# Patient Record
Sex: Female | Born: 1975 | Race: White | Hispanic: No | Marital: Married | Smoking: Former smoker
Health system: Southern US, Community
[De-identification: ages and names within clinical notes are randomized; demographics above are authoritative.]

## PROBLEM LIST (undated history)

## (undated) DIAGNOSIS — F329 Major depressive disorder, single episode, unspecified: Secondary | ICD-10-CM

## (undated) DIAGNOSIS — E785 Hyperlipidemia, unspecified: Secondary | ICD-10-CM

## (undated) DIAGNOSIS — F32A Depression, unspecified: Secondary | ICD-10-CM

## (undated) DIAGNOSIS — T7840XA Allergy, unspecified, initial encounter: Secondary | ICD-10-CM

## (undated) HISTORY — DX: Depression, unspecified: F32.A

## (undated) HISTORY — DX: Major depressive disorder, single episode, unspecified: F32.9

## (undated) HISTORY — PX: TONSILLECTOMY: SUR1361

## (undated) HISTORY — DX: Allergy, unspecified, initial encounter: T78.40XA

## (undated) HISTORY — PX: WISDOM TOOTH EXTRACTION: SHX21

---

## 2008-01-23 ENCOUNTER — Ambulatory Visit: Payer: Self-pay | Admitting: Diagnostic Radiology

## 2008-01-23 ENCOUNTER — Emergency Department (HOSPITAL_BASED_OUTPATIENT_CLINIC_OR_DEPARTMENT_OTHER): Admission: EM | Admit: 2008-01-23 | Discharge: 2008-01-23 | Payer: Self-pay | Admitting: Emergency Medicine

## 2008-07-12 ENCOUNTER — Emergency Department (HOSPITAL_BASED_OUTPATIENT_CLINIC_OR_DEPARTMENT_OTHER): Admission: EM | Admit: 2008-07-12 | Discharge: 2008-07-12 | Payer: Self-pay | Admitting: Emergency Medicine

## 2008-10-04 ENCOUNTER — Ambulatory Visit: Payer: Self-pay | Admitting: Family Medicine

## 2008-10-04 DIAGNOSIS — F339 Major depressive disorder, recurrent, unspecified: Secondary | ICD-10-CM | POA: Insufficient documentation

## 2008-10-04 DIAGNOSIS — F329 Major depressive disorder, single episode, unspecified: Secondary | ICD-10-CM

## 2008-10-04 DIAGNOSIS — J309 Allergic rhinitis, unspecified: Secondary | ICD-10-CM | POA: Insufficient documentation

## 2008-10-07 LAB — CONVERTED CEMR LAB
ALT: 12 units/L (ref 0–35)
AST: 15 units/L (ref 0–37)
Alkaline Phosphatase: 69 units/L (ref 39–117)
Calcium: 9.6 mg/dL (ref 8.4–10.5)
Glucose, Bld: 73 mg/dL (ref 70–99)
Potassium: 4.5 meq/L (ref 3.5–5.3)
Sodium: 143 meq/L (ref 135–145)

## 2008-11-11 ENCOUNTER — Ambulatory Visit: Payer: Self-pay | Admitting: Family Medicine

## 2008-11-11 DIAGNOSIS — M654 Radial styloid tenosynovitis [de Quervain]: Secondary | ICD-10-CM | POA: Insufficient documentation

## 2008-11-14 ENCOUNTER — Encounter: Payer: Self-pay | Admitting: Family Medicine

## 2009-01-27 ENCOUNTER — Encounter: Admission: RE | Admit: 2009-01-27 | Discharge: 2009-01-27 | Payer: Self-pay | Admitting: Family Medicine

## 2009-01-27 ENCOUNTER — Ambulatory Visit: Payer: Self-pay | Admitting: Family Medicine

## 2009-01-27 DIAGNOSIS — N644 Mastodynia: Secondary | ICD-10-CM | POA: Insufficient documentation

## 2009-07-11 ENCOUNTER — Ambulatory Visit: Payer: Self-pay | Admitting: Family Medicine

## 2009-08-22 ENCOUNTER — Ambulatory Visit: Payer: Self-pay | Admitting: Family Medicine

## 2009-08-22 DIAGNOSIS — J019 Acute sinusitis, unspecified: Secondary | ICD-10-CM | POA: Insufficient documentation

## 2009-09-02 ENCOUNTER — Telehealth: Payer: Self-pay | Admitting: Family Medicine

## 2009-12-19 ENCOUNTER — Ambulatory Visit: Payer: Self-pay | Admitting: Family Medicine

## 2009-12-23 ENCOUNTER — Encounter: Payer: Self-pay | Admitting: Family Medicine

## 2009-12-24 LAB — CONVERTED CEMR LAB
BUN: 15 mg/dL (ref 6–23)
CO2: 28 meq/L (ref 19–32)
Creatinine, Ser: 0.67 mg/dL (ref 0.40–1.20)
Glucose, Bld: 93 mg/dL (ref 70–99)
LDL Cholesterol: 95 mg/dL (ref 0–99)
Total Bilirubin: 0.4 mg/dL (ref 0.3–1.2)
Total CHOL/HDL Ratio: 5.2
VLDL: 14 mg/dL (ref 0–40)

## 2010-02-10 NOTE — Assessment & Plan Note (Signed)
Summary: breast pain   Vital Signs:  Patient profile:   35 year old female Height:      63 inches Weight:      185 pounds BMI:     32.89 O2 Sat:      97 % on Room air Pulse rate:   82 / minute BP sitting:   115 / 74  (left arm) Cuff size:   regular  Vitals Entered By: Payton Spark CMA (January 27, 2009 1:50 PM)  O2 Flow:  Room air CC: FU mood and sertraline. Also c/o R breast tenderness x 1 month.    Primary Care Provider:  Seymour Bars DO  CC:  FU mood and sertraline. Also c/o R breast tenderness x 1 month. .  History of Present Illness: 35 yo WF presents for a R breast lump and tenderness that started a month ago.  It did not change over the course of the month.  No previous mammogram.  No fam hx of breast cancer.  Drinks 1-3 cups of caffeine each day.  Period due in 2-3 wks.  She is not on any birth control.  She finished breastfeeding 10 mos ago.  Mood has improved with addition of Sertraline.  Allergies: No Known Drug Allergies  Past History:  Past Medical History: Reviewed history from 10/04/2008 and no changes required. depression allergies  Past Surgical History: Reviewed history from 10/04/2008 and no changes required. 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 Some marrital discord. Quit smoking in 03.  Goal wt 140. No exercise.  Review of Systems      See HPI  Physical Exam  General:  alert, well-developed, well-nourished, well-hydrated, and overweight-appearing.   Head:  normocephalic and atraumatic.   Neck:  shotty R>L sided anterior cervical chain LA Breasts:  large breasted.  no palpable nodules.  No axillary LA or nipple d/c.  fibrocystic bilat. Lungs:  Normal respiratory effort, chest expands symmetrically. Lungs are clear to auscultation, no crackles or wheezes. Heart:  Normal rate and regular rhythm. S1 and  S2 normal without gallop, murmur, click, rub or other extra sounds. Skin:  color normal and no rashes.   Psych:  good eye contact, not anxious appearing, and not depressed appearing.     Impression & Recommendations:  Problem # 1:  BREAST PAIN, RIGHT (ICD-611.71) Exam c/w Fibrocystic breast tissue w/o palpable masses. Given pain with dense tissue on exam, will get a baseline screening mammogram. Advised to wear a well fitting bra, avoid caffeine and use a heating pad. Call if not improved in the next month. Orders: T-Mammography Bilateral Screening ([id number])  Problem # 2:  DEPRESSION (ICD-311) Assessment: Improved RTC 2 mos for f/u. Her updated medication list for this problem includes:    Sertraline Hcl 50 Mg Tabs (Sertraline hcl) .Marland Kitchen... 1 tab by mouth daily  Complete Medication List: 1)  Nexium 20 Mg Pack (Esomeprazole magnesium) .... Take 1 tab by mouth once daily as needed 2)  Allegra 180 Mg Tabs (Fexofenadine hcl) .Marland Kitchen.. 1 tab by mouth daily for allergies 3)  Nasonex 50 Mcg/act Susp (Mometasone furoate) .... 2 sprays per nostril daily 4)  Sertraline Hcl 50 Mg Tabs (Sertraline hcl) .Marland Kitchen.. 1 tab by mouth daily  Patient Instructions: 1)  Mammogram today. 2)  Will call you w/ results in the next 2 days. 3)  Wear a supportive bra. 4)  Cut caffeine out of diet. 5)  Work on [employer], regular exercise, weight loss. 6)  F/U for depression in 2 months.

## 2010-02-10 NOTE — Progress Notes (Signed)
Summary: Sinusitis  Phone Note Call from Patient   Caller: Patient Summary of Call: Pt seen last week for sinusitis and was given amox. Pt states she felt a bit better while on amox but since completing abx she has started w/ HA, sinus pressure and congestion.  Initial call taken by: Payton Spark CMA,  September 02, 2009 3:31 PM  Follow-up for Phone Call        I will retreat her with another round.  If not cleared, will need a CT of the sinuses after this. Follow-up by: Seymour Bars DO,  September 02, 2009 3:32 PM    New/Updated Medications: CEFDINIR 300 MG CAPS (CEFDINIR) 1 capsule by mouth q 12 hrs x 10 days Prescriptions: CEFDINIR 300 MG CAPS (CEFDINIR) 1 capsule by mouth q 12 hrs x 10 days  #20 x 0   Entered and Authorized by:   Seymour Bars DO   Signed by:   Seymour Bars DO on 09/02/2009   Method used:   [facility] to        [facility] [phone]* (retail)       [address]       [place], [place]  [address]       Ph: [phone] or [phone]       Fax: [phone]   RxID:   [id number]   Appended Document: Sinusitis Pt aware

## 2010-02-10 NOTE — Assessment & Plan Note (Signed)
Summary: CPE w/o pap   Vital Signs:  Patient profile:   35 year old female Height:      63 inches Weight:      163 pounds BMI:     28.98 O2 Sat:      97 % on Room air Pulse rate:   74 / minute BP sitting:   106 / 68  (left arm) Cuff size:   regular  Vitals Entered By: Payton Spark CMA (December 19, 2009 2:38 PM)  O2 Flow:  Room air CC: CPE w/ out pap   Primary Care Cobe Viney:  Seymour Bars DO  CC:  CPE w/ out pap.  History of Present Illness: 34 yo WF presents for CPE w/o pap smear.  She is previously healthy, doing well on Sertraline for depression.  Sees [facility] for pap smear, updated in Sept.  Had tetanus vaccine 2008.  Denies fam hx of breast or colon cancer or of premature heart dz.  Fasting labs are due.  Had flu shot this year.  Has been walking more and eating better, down another 7 lbs.    Current Medications (verified): 1)  Nexium 20 Mg Pack (Esomeprazole Magnesium) .... Take 1 Tab By Mouth Once Daily As Needed 2)  Allegra 180 Mg Tabs (Fexofenadine Hcl) .Marland Kitchen.. 1 Tab By Mouth Daily For Allergies 3)  Nasonex 50 Mcg/act Susp (Mometasone Furoate) .... 2 Sprays Per Nostril Daily 4)  Sertraline Hcl 50 Mg Tabs (Sertraline Hcl) .Marland Kitchen.. 1 Tab By Mouth Daily  Allergies (verified): No Known Drug Allergies  Past History:  Past Medical History: depression allergies  gyn [facility] in [place]  Past Surgical History: Reviewed history from 10/04/2008 and no changes required. tonsillectomy LTCS x 2  Family History: Reviewed history from 10/04/2008 and no changes required. mother healthy father ? brother and sister healthy  Social History: Reviewed history from 10/04/2008 and no changes required. 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 Some marrital discord. Quit smoking in 03.  Goal wt 140. No exercise.  Review of Systems  The patient denies anorexia, fever, weight loss, weight gain, vision loss, decreased hearing,  hoarseness, chest pain, syncope, dyspnea on exertion, peripheral edema, prolonged cough, headaches, hemoptysis, abdominal pain, melena, hematochezia, severe indigestion/heartburn, hematuria, incontinence, genital sores, muscle weakness, suspicious skin lesions, transient blindness, difficulty walking, depression, unusual weight change, abnormal bleeding, enlarged lymph nodes, angioedema, breast masses, and testicular masses.    Physical Exam  General:  alert, well-developed, well-nourished, well-hydrated, and overweight-appearing.   Head:  normocephalic and atraumatic.   Eyes:  pupils equal, pupils round, and pupils reactive to light.   Ears:  no external deformities.   Nose:  no nasal discharge.   Mouth:  good dentition and pharynx pink and moist.   Neck:  supple and no masses.   Lungs:  Normal respiratory effort, chest expands symmetrically. Lungs are clear to auscultation, no crackles or wheezes. Heart:  Normal rate and regular rhythm. S1 and S2 normal without gallop, murmur, click, rub or other extra sounds. Abdomen:  soft, non-tender, normal bowel sounds, no distention, no masses, no guarding, no hepatomegaly, and no splenomegaly.   Pulses:  2+ radial and pedal pulses Extremities:  no UE or LE edema Neurologic:  gait normal.   Skin:  fair skinned Cervical Nodes:  No lymphadenopathy noted Psych:  good eye contact, not anxious appearing, and not depressed appearing.     Impression & Recommendations:  Problem # 1:  HEALTH MAINTENANCE EXAM (ICD-V70.0) Keeping healthy checklist for women reviewed. BP at goal.  BMI 28= overwt, down another 7 lbs, great! Keep working on [employer], regular exercise. Add Calcium with D to MVI daily. Immunizations are UTD. Paps per [facility]. Pt will call for her annual derm exam.  Complete Medication List: 1)  Nexium 20 Mg Pack (Esomeprazole magnesium) .... Take 1 tab by mouth once daily as needed 2)  Allegra 180 Mg Tabs (Fexofenadine hcl) .Marland Kitchen.. 1  tab by mouth daily for allergies 3)  Nasonex 50 Mcg/act Susp (Mometasone furoate) .... 2 sprays per nostril daily 4)  Sertraline Hcl 50 Mg Tabs (Sertraline hcl) .Marland Kitchen.. 1 tab by mouth daily  Other Orders: T-Comprehensive Metabolic Panel [id number]) T-Lipid Profile [id number])   Orders Added: 1)  T-Comprehensive Metabolic Panel [[id number]] 2)  T-Lipid Profile [[id number]] 3)  Est. Patient age 69-39 [phone]

## 2010-02-10 NOTE — Assessment & Plan Note (Signed)
Summary: f/u mood   Vital Signs:  Patient profile:   35 year old female Height:      63 inches Weight:      180 pounds BMI:     32.00 Pulse rate:   91 / minute BP sitting:   120 / 71  (left arm) Cuff size:   regular  Vitals Entered By: Kathlene November (July 11, 2009 1:59 PM) CC: followup med- Sertraline working well. Needs refill on Nexium   Primary Care Provider:  Seymour Bars DO  CC:  followup med- Sertraline working well. Needs refill on Nexium.  History of Present Illness: 35 yo WF presents for f/u depression.  She was started back on Sertraline in Sept.  She has done well on it.  She started [employer].  Denies any problems.  No acute life stressors.  Would like to lose more wt.  PHQ -9 is due to recheck today.      Current Medications (verified): 1)  Nexium 20 Mg Pack (Esomeprazole Magnesium) .... Take 1 Tab By Mouth Once Daily As Needed 2)  Allegra 180 Mg Tabs (Fexofenadine Hcl) .Marland Kitchen.. 1 Tab By Mouth Daily For Allergies 3)  Nasonex 50 Mcg/act Susp (Mometasone Furoate) .... 2 Sprays Per Nostril Daily 4)  Sertraline Hcl 50 Mg Tabs (Sertraline Hcl) .Marland Kitchen.. 1 Tab By Mouth Daily  Allergies (verified): No Known Drug Allergies  Comments:  Nurse/Medical Assistant: The patient's medications and allergies were reviewed with the patient and were updated in the Medication and Allergy Lists. Kathlene November (July 11, 2009 2:00 PM)  Past History:  Past Medical History: Reviewed history from 10/04/2008 and no changes required. depression allergies  Social History: Reviewed history from 10/04/2008 and no changes required. 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 Some marrital discord. Quit smoking in 03.  Goal wt 140. No exercise.  Review of Systems      See HPI  Physical Exam  General:  alert, well-developed, well-nourished, well-hydrated, and overweight-appearing.   Head:  normocephalic and atraumatic.   Psych:  good eye contact, not  anxious appearing, and not depressed appearing.     Impression & Recommendations:  Problem # 1:  DEPRESSION (ICD-311) PHQ -9 score 1.  In remission.  Continue current meds.  f/u in 4 mos. Her updated medication list for this problem includes:    Sertraline Hcl 50 Mg Tabs (Sertraline hcl) .Marland Kitchen... 1 tab by mouth daily  Complete Medication List: 1)  Nexium 20 Mg Pack (Esomeprazole magnesium) .... Take 1 tab by mouth once daily as needed 2)  Allegra 180 Mg Tabs (Fexofenadine hcl) .Marland Kitchen.. 1 tab by mouth daily for allergies 3)  Nasonex 50 Mcg/act Susp (Mometasone furoate) .... 2 sprays per nostril daily 4)  Sertraline Hcl 50 Mg Tabs (Sertraline hcl) .Marland Kitchen.. 1 tab by mouth daily 5)  Weight Watchers  .... Dx: obesity  Patient Instructions: 1)  Return in 4 mos for CPE w/o pap smear, labs. Prescriptions: SERTRALINE HCL 50 MG TABS (SERTRALINE HCL) 1 tab by mouth daily  #30 x 5   Entered and Authorized by:   Seymour Bars DO   Signed by:   Seymour Bars DO on 07/11/2009   Method used:   [facility] [phone][facility] ([facility]ail[facility]       [address]       [place], [place]  [address]  Ph: [phone] or [phone]       Fax: [phone]   RxID:   [id number] WEIGHT WATCHERS Dx: obesity  #1 x 0   Entered and Authorized by:   Seymour Bars DO   Signed by:   Seymour Bars DO on 07/11/2009   Method used:   Print then Give to Patient   RxID:   [id number] NEXIUM 20 MG PACK (ESOMEPRAZOLE MAGNESIUM) Take 1 tab by mouth once daily as needed  #30 x 3   Entered and Authorized by:   Seymour Bars DO   Signed by:   Seymour Bars DO on 07/11/2009   Method used:   [facility] to        [facility] [phone][facility] (retail)       [address]       [place], [place]  [address]       Ph: [phone] or [phone]       Fax: [phone]   RxID:   [id number]

## 2010-02-10 NOTE — Assessment & Plan Note (Signed)
Summary: sinusitis   Vital Signs:  Patient profile:   35 year old female Height:      63 inches Weight:      170 pounds BMI:     30.22 O2 Sat:      98 % on Room air Temp:     98.1 degrees F oral Pulse rate:   78 / minute BP sitting:   106 / 70  (left arm) Cuff size:   regular  Vitals Entered By: Payton Spark CMA (August 22, 2009 8:21 AM)  O2 Flow:  Room air CC: Cough, congestion, and facial pressure x 3 weeks.   Primary Care Provider:  Seymour Bars DO  CC:  Cough, congestion, and and facial pressure x 3 weeks.Marland Kitchen  History of Present Illness: 35 yo WF presents for a sore throat that began 2-3 wks ago.  She has also had rhinorrhea, nasal congestion and dry cough.  She has been taking Allegra and Nasonex but she is not clearing.  She has some production with her cough.  Denies chest tightness, SOB or hx of asthma.  She is not a smoker.  She started having sinus pressure behind her eyes a few days ago.  Denies Fevers, chills or GI upset.    Current Medications (verified): 1)  Nexium 20 Mg Pack (Esomeprazole Magnesium) .... Take 1 Tab By Mouth Once Daily As Needed 2)  Allegra 180 Mg Tabs (Fexofenadine Hcl) .Marland Kitchen.. 1 Tab By Mouth Daily For Allergies 3)  Nasonex 50 Mcg/act Susp (Mometasone Furoate) .... 2 Sprays Per Nostril Daily 4)  Sertraline Hcl 50 Mg Tabs (Sertraline Hcl) .Marland Kitchen.. 1 Tab By Mouth Daily  Allergies (verified): No Known Drug Allergies  Past History:  Past Medical History: Reviewed history from 10/04/2008 and no changes required. depression allergies  Social History: Reviewed history from 10/04/2008 and no changes required. 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 Some marrital discord. Quit smoking in 03.  Goal wt 140. No exercise.  Review of Systems      See HPI  Physical Exam  General:  alert, well-developed, well-nourished, and well-hydrated.   Head:  normocephalic and atraumatic.  frontal and maxillary sinuses  TTP Eyes:  conjunctiva mildy injected and eyes watery bilat Ears:  EACs patent; TMs translucent and gray with good cone of light and bony landmarks.  Nose:  boggy turbinates with nasal congestion Mouth:  cobblestoning, o/p w/o injection, exudates or vesicles Neck:  no masses.   Lungs:  Normal respiratory effort, chest expands symmetrically. Lungs are clear to auscultation, no crackles or wheezes.  dry cough Heart:  Normal rate and regular rhythm. S1 and S2 normal without gallop, murmur, click, rub or other extra sounds. Skin:  color normal.   Cervical Nodes:  shotty anterior cervical LNs   Impression & Recommendations:  Problem # 1:  ACUTE SINUSITIS, UNSPECIFIED (ICD-461.9) ABS secondary to underlying allergies.  Symptoms x 2-3 wks now.  Treat with Amoxicillin x 10 days.  Use Advil Cold and Sinus and stay on nasal steroid + daily anti histamine for allergies.  Call if not improved in 10 days. Her updated medication list for this problem includes:    Nasonex 50 Mcg/act Susp (Mometasone furoate) .Marland Kitchen... 2 sprays per nostril daily    Amoxicillin 875 Mg Tabs (Amoxicillin) .Marland Kitchen... 1 tab by mouth q 12 hrs x 10 days  Complete Medication List: 1)  Nexium 20 Mg Pack (Esomeprazole magnesium) .... Take 1 tab by mouth once  daily as needed 2)  Allegra 180 Mg Tabs (Fexofenadine hcl) .Marland Kitchen.. 1 tab by mouth daily for allergies 3)  Nasonex 50 Mcg/act Susp (Mometasone furoate) .... 2 sprays per nostril daily 4)  Sertraline Hcl 50 Mg Tabs (Sertraline hcl) .Marland Kitchen.. 1 tab by mouth daily 5)  Amoxicillin 875 Mg Tabs (Amoxicillin) .Marland Kitchen.. 1 tab by mouth q 12 hrs x 10 days  Patient Instructions: 1)  Take 10 days of Amoxicillin for sinus infection. 2)  Stay on allergy meds and add Advil Cold and Sinus for symptom relief. 3)  Call if not improved in 10 days. Prescriptions: AMOXICILLIN 875 MG TABS (AMOXICILLIN) 1 tab by mouth q 12 hrs x 10 days  #20 x 0   Entered and Authorized by:   Seymour Bars DO   Signed by:   Seymour Bars DO on 08/22/2009   Method used:   [facility] to        [facility] [phone][facility] (retail)       [address]       [place], [place]  [address]       Ph: [phone] or [phone]       Fax: [phone]   RxID:   [id number]

## 2010-02-25 ENCOUNTER — Ambulatory Visit (INDEPENDENT_AMBULATORY_CARE_PROVIDER_SITE_OTHER): Payer: BC Managed Care – PPO | Admitting: Family Medicine

## 2010-02-25 ENCOUNTER — Encounter: Payer: Self-pay | Admitting: Family Medicine

## 2010-02-25 DIAGNOSIS — J020 Streptococcal pharyngitis: Secondary | ICD-10-CM | POA: Insufficient documentation

## 2010-02-25 LAB — CONVERTED CEMR LAB: Rapid Strep: POSITIVE

## 2010-03-04 NOTE — Assessment & Plan Note (Signed)
Summary: strep throat   Vital Signs:  Patient profile:   35 year old female Height:      63 inches Weight:      162 pounds BMI:     28.80 O2 Sat:      98 % on Room air Temp:     98.3 degrees F oral Pulse rate:   83 / minute BP sitting:   115 / 72  (left arm) Cuff size:   regular  Vitals Entered By: Payton Spark CMA (February 25, 2010 9:40 AM)  O2 Flow:  Room air CC: STx 5 days.   Primary Care Provider:  Seymour Bars DO  CC:  STx 5 days.Marland Kitchen  History of Present Illness: 35 yo WF presents for sore throat and fatigue with a low grade fever x 5 day.  No runny nose or cough.  Her kids have been sick with fever.    She is taking Mucinex and Ibuprofen.  She is not eating much due to odynophagia.  No GI upset or rash.     Current Medications (verified): 1)  Nexium 20 Mg Pack (Esomeprazole Magnesium) .... Take 1 Tab By Mouth Once Daily As Needed 2)  Allegra 180 Mg Tabs (Fexofenadine Hcl) .Marland Kitchen.. 1 Tab By Mouth Daily For Allergies 3)  Nasonex 50 Mcg/act Susp (Mometasone Furoate) .... 2 Sprays Per Nostril Daily 4)  Sertraline Hcl 50 Mg Tabs (Sertraline Hcl) .Marland Kitchen.. 1 Tab By Mouth Daily  Allergies (verified): No Known Drug Allergies  Past History:  Past Medical History: Reviewed history from 12/19/2009 and no changes required. depression allergies  gyn [facility] in [place]  Past Surgical History: Reviewed history from 10/04/2008 and no changes required. 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 Some marrital discord. Quit smoking in 03.  Goal wt 140. No exercise.  Review of Systems      See HPI  Physical Exam  General:  alert, well-developed, well-nourished, and well-hydrated.   Head:  normocephalic and atraumatic.   Eyes:  conjunctiva Ears:  EACs patent; TMs translucent and gray with good cone of light and bony landmarks.  Nose:  no  nasal congestion Mouth:  o/p erythema, no exudates or vesicles, surgically abscent tonsils; patent airway Neck:  no masses.   Lungs:  Normal respiratory effort, chest expands symmetrically. Lungs are clear to auscultation, no crackles or wheezes. Heart:  Normal rate and regular rhythm. S1 and S2 normal without gallop, murmur, click, rub or other extra sounds. Skin:  color normal and no rashes.   Cervical Nodes:  shoddy anterior cervical LA   Impression & Recommendations:  Problem # 1:  STREPTOCOCCAL SORE THROAT (ICD-034.0) Rapid strep +.  Will treat with Bicillin LA 1 x injection today, Ibuprofen for fevers and sore throat pain, Cepacol spray, rest, clear fluids and out of work note for today and tomorrow.  Call if any problems. Orders: Rapid Strep (16109) Bicillin LA 1.2 million units Injection (U0454) Admin of Therapeutic Inj  intramuscular or subcutaneous ([address]11)  Complete Medication List: 1)  Nexium 20 Mg Pack (Esomeprazole magnesium) .... Take 1 tab by mouth once daily as needed 2)  Allegra 180 Mg Tabs (Fexofenadine hcl) .Marland Kitchen.. 1 tab by mouth daily for allergies 3)  Nasonex 50 Mcg/act Susp (Mometasone furoate) .... 2 sprays per nostril daily 4)  Sertraline Hcl 50 Mg Tabs (Sertraline hcl) .Marland Kitchen.. 1 tab by  mouth daily  Patient Instructions: 1)  Bicillin LA given today for Strep Throat. 2)  Rest, drink plenty of clear fluids, ibuprofen for sore throat pain, Cepacol spray and popsicles.   3)  If not feeling better in 5-7 days, please call.   Medication Administration  Injection # 1:    Medication: Bicillin LA 1.2 million units Injection    Diagnosis: STREPTOCOCCAL SORE THROAT (ICD-034.0)    Route: IM    Site: LUOQ gluteus    Exp Date: 05/2012    Lot #: [id number]    Patient tolerated injection without complications    Given by: Payton Spark CMA (February 25, 2010 10:07 AM)  Orders Added: 1)  Rapid Strep [60454] 2)  Bicillin LA 1.2 million units Injection [J0561] 3)  Admin of  Therapeutic Inj  intramuscular or subcutaneous [96372] 4)  Est. Patient Level III [99213]    Laboratory Results    Other Tests  Rapid Strep: positive    Medication Administration  Injection # 1:    Medication: Bicillin LA 1.2 million units Injection    Diagnosis: STREPTOCOCCAL SORE THROAT (ICD-034.0)    Route: IM    Site: LUOQ gluteus    Exp Date: 05/2012    Lot #: [id number]    Patient tolerated injection without complications    Given by: Payton Spark CMA (February 25, 2010 10:07 AM)  Orders Added: 1)  Rapid Strep [91478] 2)  Bicillin LA 1.2 million units Injection [J0561] 3)  Admin of Therapeutic Inj  intramuscular or subcutaneous [96372] 4)  Est. Patient Level III [29562]

## 2010-03-04 NOTE — Letter (Signed)
Summary: Out of Work  [facility]  [address], [address]   [place], [place] [address]   Phone: [phone]  Fax: [phone]    February 25, 2010   Employee:  TAHLIYAH ANAGNOS    To Whom It May Concern:   For Medical reasons, please excuse the above named employee from work for the following dates:  Start:   Feb 15th -16th  End:   Feb 17th  If you need additional information, please feel free to contact our office.         Sincerely,    Seymour Bars DO

## 2010-03-18 ENCOUNTER — Ambulatory Visit (INDEPENDENT_AMBULATORY_CARE_PROVIDER_SITE_OTHER): Payer: BC Managed Care – PPO | Admitting: Family Medicine

## 2010-03-18 ENCOUNTER — Encounter: Payer: Self-pay | Admitting: Family Medicine

## 2010-03-18 DIAGNOSIS — J301 Allergic rhinitis due to pollen: Secondary | ICD-10-CM | POA: Insufficient documentation

## 2010-03-24 NOTE — Assessment & Plan Note (Signed)
Summary: hay fever   Vital Signs:  Patient profile:   35 year old female Height:      63 inches Weight:      165 pounds BMI:     29.33 O2 Sat:      98 % on Room air Temp:     98.2 degrees F oral Pulse rate:   90 / minute BP sitting:   104 / 74  (left arm) Cuff size:   large  Vitals Entered By: Payton Spark CMA (March 18, 2010 1:25 PM)  O2 Flow:  Room air CC: ? Strep throat   Primary Care Provider:  Seymour Bars DO  CC:  ? Strep throat.  History of Present Illness: 35 yo WF presents for sore throat, bodyaches, rhinorrhea and fatigue.  She does have allergies.  She was concerned about having strep throat again.  Her throat is not hurting as bad as last time.  She is using Flonase and Allegra D daily which is helping some.  She has a lot of sinus pain and pressure with HAs.  No fevers or chills.  Sleeping well atnight.  Denies chest tightness, SOB or wheezing.  No hx of asthma.  Spring and Fall are usally her the worst for her.     Current Medications (verified): 1)  Nexium 20 Mg Pack (Esomeprazole Magnesium) .... Take 1 Tab By Mouth Once Daily As Needed 2)  Allegra 180 Mg Tabs (Fexofenadine Hcl) .Marland Kitchen.. 1 Tab By Mouth Daily For Allergies 3)  Nasonex 50 Mcg/act Susp (Mometasone Furoate) .... 2 Sprays Per Nostril Daily 4)  Sertraline Hcl 50 Mg Tabs (Sertraline Hcl) .Marland Kitchen.. 1 Tab By Mouth Daily  Allergies (verified): No Known Drug Allergies  Past History:  Past Medical History: Reviewed history from 12/19/2009 and no changes required. depression allergies  gyn [facility] in [place]  Past Surgical History: Reviewed history from 10/04/2008 and no changes required. 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  Married to [place] with 2 young kids. Some marrital discord. Quit smoking in 03.  Goal wt 140. No exercise.  Review of Systems      See HPI  Physical Exam  General:   alert, well-developed, well-nourished, and well-hydrated.   Head:  normocephalic and atraumatic.  sinuses NTTP Eyes:  conjunctiva clear; slightly watery, no lid edema Ears:  EACs patent; TMs translucent and gray with good cone of light and bony landmarks.  Nose:  no rhinorrhea, no nasal congesiton Mouth:  o/p pink and moist, no injection, vesicles, exudate or cobblestoning Neck:  no masses.   Lungs:  Normal respiratory effort, chest expands symmetrically. Lungs are clear to auscultation, no crackles or wheezes. Heart:  Normal rate and regular rhythm. S1 and S2 normal without gallop, murmur, click, rub or other extra sounds. Skin:  color normal.   Cervical Nodes:  No lymphadenopathy noted   Impression & Recommendations:  Problem # 1:  HAY FEVER (ICD-477.0)  Seaonal flare of allergies despite use of Allegra D + Flonase.  New onset sinus pressure in the abscence of much head congestion or postnasal drip, normal lung exam.  Given hx of allergies with recurring sinusitis, consider referral to allergist (saw ENT yrs ago).  Will change her Flonase to Veramyst since small particle size may stick better and will start Prednisone 40 mg/ day x 5 days for allergy flare.  No sign of ABS that would requrie abx  at this time.  Call in 10 days and let me know how she's doing.  Orders: Rapid Strep (16109)  Complete Medication List: 1)  Nexium 20 Mg Pack (Esomeprazole magnesium) .... Take 1 tab by mouth once daily as needed 2)  Allegra 180 Mg Tabs (Fexofenadine hcl) .Marland Kitchen.. 1 tab by mouth daily for allergies 3)  Veramyst 27.5 Mcg/spray Susp (Fluticasone furoate) .... 2 sprays/ nostril once daily 4)  Sertraline Hcl 50 Mg Tabs (Sertraline hcl) .Marland Kitchen.. 1 tab by mouth daily 5)  Prednisone 20 Mg Tabs (Prednisone) .... 2 tabs by mouth once daily x 5 days  Patient Instructions: 1)  Change Flonase to Veramyst daily and stay on Allegra D. 2)  Start Prednisone 40 mg once daily x 5 days for hay fever. 3)  If not  improved in 7 days, please call. 4)  If you need something in addition for pain, useTylenol. 5)  Consider allergy or ENT referral if not improving.   Prescriptions: PREDNISONE 20 MG TABS (PREDNISONE) 2 tabs by mouth once daily x 5 days  #10 x 0   Entered and Authorized by:   Seymour Bars DO   Signed by:   Seymour Bars DO on 03/18/2010   Method used:   Electronically to        [facility] [phone][facility] (retail)       [address]       [place], [place]  [address]       Ph: [phone] or [phone]       Fax: [phone]   RxID:   [id number] VERAMYST 27.5 MCG/SPRAY SUSP (FLUTICASONE FUROATE) 2 sprays/ nostril once daily  #1 bottle x 1   Entered and Authorized by:   Seymour Bars DO   Signed by:   Seymour Bars DO on 03/18/2010   Method used:   Electronically to        [facility] [phone][facility] (retail)       [address]       [place], [place]  [address]       Ph: [phone] or [phone]       Fax: [phone]   RxID:   [id number]    Orders Added: 1)  Rapid Strep [23762] 2)  Est. Patient Level III [83151]    Laboratory Results    Other Tests  Rapid Strep: negative

## 2010-03-25 ENCOUNTER — Ambulatory Visit: Payer: Self-pay | Admitting: Family Medicine

## 2010-06-11 ENCOUNTER — Other Ambulatory Visit: Payer: Self-pay | Admitting: Family Medicine

## 2010-09-04 ENCOUNTER — Other Ambulatory Visit: Payer: Self-pay | Admitting: Family Medicine

## 2010-09-17 ENCOUNTER — Other Ambulatory Visit: Payer: Self-pay | Admitting: Family Medicine

## 2010-09-17 MED ORDER — SERTRALINE HCL 50 MG PO TABS: 50.0000 mg | ORAL_TABLET | Freq: Every day | ORAL | Status: DC

## 2010-09-17 NOTE — Telephone Encounter (Addendum)
Pt called and said [facility] been requesting refill for her generic zoloft, and has not received a reply from our office.  Pt has CPE that will be due in 12-2010.   Plan:  Refilled medication for #30/2 refills, and then pt will need CPE in December. Jarvis Newcomer, LPN [facility]

## 2010-12-11 ENCOUNTER — Encounter: Payer: Self-pay | Admitting: Family Medicine

## 2010-12-17 ENCOUNTER — Encounter: Payer: Self-pay | Admitting: Family Medicine

## 2010-12-17 ENCOUNTER — Ambulatory Visit (INDEPENDENT_AMBULATORY_CARE_PROVIDER_SITE_OTHER): Payer: BC Managed Care – PPO | Admitting: Family Medicine

## 2010-12-17 VITALS — BP 115/78 | HR 88 | Wt 191.0 lb

## 2010-12-17 DIAGNOSIS — F329 Major depressive disorder, single episode, unspecified: Secondary | ICD-10-CM

## 2010-12-17 DIAGNOSIS — K219 Gastro-esophageal reflux disease without esophagitis: Secondary | ICD-10-CM

## 2010-12-17 DIAGNOSIS — J309 Allergic rhinitis, unspecified: Secondary | ICD-10-CM

## 2010-12-17 DIAGNOSIS — F32A Depression, unspecified: Secondary | ICD-10-CM

## 2010-12-17 MED ORDER — FLUTICASONE FUROATE 27.5 MCG/SPRAY NA SUSP: 2.0000 | Freq: Every day | NASAL | Status: DC

## 2010-12-17 MED ORDER — SERTRALINE HCL 50 MG PO TABS: 50.0000 mg | ORAL_TABLET | Freq: Every day | ORAL | Status: DC

## 2010-12-17 NOTE — Progress Notes (Signed)
  Subjective:    Patient ID: Jessica Cunningham, female    DOB: 02-04-1975, 35 y.o.   MRN: [id number]  HPI F/U depression-Doing well on her current regimen. Says doesn't want to come off her regimen in the winter time. Toerating it well. No SE.  Sleeping well.  No CP or SOB.   GERD- Uses nexium prn when the OTC prudcts don't work  AR- has been using OTC anthistamine and her veramyst every 3 days. Lately had to inc her use of veramyst.  Says getting a lot of sinus pressure and HA. Occ uses a decongestant and that helps too.    Review of Systems     Objective:   Physical Exam  Constitutional: She is oriented to person, place, and time. She appears well-developed and well-nourished.  HENT:  Head: Normocephalic and atraumatic.  Cardiovascular: Normal rate, regular rhythm and normal heart sounds.   Pulmonary/Chest: Effort normal and breath sounds normal.  Neurological: She is alert and oriented to person, place, and time.  Skin: Skin is warm and dry.  Psychiatric: She has a normal mood and affect. Her behavior is normal.          Assessment & Plan:  Depression - PHQ-9 score is 5. Discussed she really wants to stay on current regimen. F/U in 1 year. Discussed to consider going off in the spring if she would like. Says she will thinks about it  AR- Will refill Nasal steorids. If sxs worsen please f/u. Contiuen OTC decongestant and antihistamine.   GERD - refilled her nexium. Discussed inc risk of fractures w/ chronic use.

## 2011-01-11 ENCOUNTER — Other Ambulatory Visit: Payer: Self-pay | Admitting: Family Medicine

## 2011-10-29 ENCOUNTER — Encounter: Payer: Self-pay | Admitting: Family Medicine

## 2011-10-29 ENCOUNTER — Ambulatory Visit (INDEPENDENT_AMBULATORY_CARE_PROVIDER_SITE_OTHER): Payer: BC Managed Care – PPO | Admitting: Family Medicine

## 2011-10-29 VITALS — BP 124/79 | HR 86 | Ht 62.5 in | Wt 177.0 lb

## 2011-10-29 DIAGNOSIS — F329 Major depressive disorder, single episode, unspecified: Secondary | ICD-10-CM

## 2011-10-29 DIAGNOSIS — F32A Depression, unspecified: Secondary | ICD-10-CM

## 2011-10-29 MED ORDER — FLUOXETINE HCL 10 MG PO TABS: ORAL_TABLET | ORAL | Status: DC

## 2011-10-29 NOTE — Progress Notes (Signed)
  Subjective:    Patient ID: Jessica Cunningham, female    DOB: [birth year], 36 y.o.   MRN: [id number]  HPI She weaned off her zoloft about 8 months ago.  Says in the last month has been more irritable, short fuse, adn periods of sadness and apathy. Feels her emotions are restarting.  Says she would like to restart it.  Says winter is hard for her. Has been through counseling several times before. Says starting a new group at church and is hoping that will help. Says she feels she gained weight on it.  Has lost 20 lbs since came off. Didn't do well on Wellbutrin.    Review of Systems     Objective:   Physical Exam  Constitutional: She is oriented to person, place, and time. She appears well-developed and well-nourished.  HENT:  Head: Normocephalic and atraumatic.  Cardiovascular: Normal rate, regular rhythm and normal heart sounds.   Pulmonary/Chest: Effort normal and breath sounds normal.  Neurological: She is alert and oriented to person, place, and time.  Skin: Skin is warm and dry.  Psychiatric: She has a normal mood and affect. Her behavior is normal.          Assessment & Plan:  Depression - PHQ- 9 score of 7 today.  Declined counseling.  Will try staring fluoxetine to see if works but without the weight gain. F/U in 1 months. Call  If changes her mind and decides she would like to restart the sertraline instead. It was she will do well with this one.  Declined flu vaccine. She says she would at the [facility] department this year.

## 2011-11-29 ENCOUNTER — Encounter: Payer: Self-pay | Admitting: Family Medicine

## 2011-11-29 ENCOUNTER — Ambulatory Visit (INDEPENDENT_AMBULATORY_CARE_PROVIDER_SITE_OTHER): Payer: BC Managed Care – PPO | Admitting: Family Medicine

## 2011-11-29 VITALS — BP 124/73 | HR 75 | Ht 60.5 in | Wt 173.0 lb

## 2011-11-29 DIAGNOSIS — F32A Depression, unspecified: Secondary | ICD-10-CM

## 2011-11-29 DIAGNOSIS — F329 Major depressive disorder, single episode, unspecified: Secondary | ICD-10-CM

## 2011-11-29 MED ORDER — FLUOXETINE HCL 20 MG PO CAPS: 20.0000 mg | ORAL_CAPSULE | Freq: Every day | ORAL | Status: DC

## 2011-11-29 NOTE — Progress Notes (Signed)
  Subjective:    Patient ID: Jessica Cunningham, female    DOB: 04-29-1975, 36 y.o.   MRN: [id number]  HPI Depression - Doing well.  Says she has not had any S.E On the medications. Sleeping well..  Mood well controlled. She feels like herself.  Taking 20mg  of fluoxetine.    Review of Systems     Objective:   Physical Exam  Constitutional: She is oriented to person, place, and time. She appears well-developed and well-nourished.  HENT:  Head: Normocephalic and atraumatic.  Cardiovascular: Normal rate, regular rhythm and normal heart sounds.   Pulmonary/Chest: Effort normal and breath sounds normal.  Neurological: She is alert and oriented to person, place, and time.  Skin: Skin is warm and dry.  Psychiatric: She has a normal mood and affect. Her behavior is normal.          Assessment & Plan:  Depression - PHQ- 9 score of 2.  Doing very well. Continue current regimen.  New rx sent.   F/U in 8 weeks.

## 2012-01-17 ENCOUNTER — Other Ambulatory Visit: Payer: Self-pay | Admitting: Family Medicine

## 2012-02-29 ENCOUNTER — Other Ambulatory Visit: Payer: Self-pay | Admitting: Family Medicine

## 2012-06-01 ENCOUNTER — Encounter: Payer: Self-pay | Admitting: Family Medicine

## 2012-06-01 ENCOUNTER — Ambulatory Visit (INDEPENDENT_AMBULATORY_CARE_PROVIDER_SITE_OTHER): Payer: BC Managed Care – PPO | Admitting: Family Medicine

## 2012-06-01 VITALS — BP 111/64 | HR 68 | Wt 188.0 lb

## 2012-06-01 DIAGNOSIS — F329 Major depressive disorder, single episode, unspecified: Secondary | ICD-10-CM

## 2012-06-01 DIAGNOSIS — R5383 Other fatigue: Secondary | ICD-10-CM

## 2012-06-01 DIAGNOSIS — J309 Allergic rhinitis, unspecified: Secondary | ICD-10-CM

## 2012-06-01 DIAGNOSIS — R5381 Other malaise: Secondary | ICD-10-CM

## 2012-06-01 DIAGNOSIS — F32A Depression, unspecified: Secondary | ICD-10-CM

## 2012-06-01 LAB — CBC
MCH: 28.9 pg (ref 26.0–34.0)
MCV: 85.6 fL (ref 78.0–100.0)
Platelets: 136 10*3/uL — ABNORMAL LOW (ref 150–400)
RDW: 13.4 % (ref 11.5–15.5)
WBC: 8.6 10*3/uL (ref 4.0–10.5)

## 2012-06-01 LAB — IRON: Iron: 59 ug/dL (ref 42–145)

## 2012-06-01 LAB — VITAMIN B12: Vitamin B-12: 574 pg/mL (ref 211–911)

## 2012-06-01 MED ORDER — FLUTICASONE FUROATE 27.5 MCG/SPRAY NA SUSP: 2.0000 | Freq: Every day | NASAL | Status: DC

## 2012-06-01 MED ORDER — FLUOXETINE HCL 40 MG PO CAPS: 40.0000 mg | ORAL_CAPSULE | Freq: Every day | ORAL | Status: DC

## 2012-06-01 NOTE — Progress Notes (Signed)
  Subjective:    Patient ID: Jessica Cunningham, female    DOB: [birth year], 37 y.o.   MRN: [id number]  HPI   Depression-the last couple months she has felt like the fluoxetine is not working quite as well. Has been much mor fatigued.  Did have some more panic attacks over the [facility] but that finally leveled off. Has been more apethetic and not motivation and desire.  Has to make herself do something.  No major new stresses.   AR - needs refill on nasal spray.    Review of Systems No fever or sweats.  No swollen glands or bumps.  No hx of thyroid problems. Has gained weight as well.     Objective:   Physical Exam  Constitutional: She is oriented to person, place, and time. She appears well-developed and well-nourished.  HENT:  Head: Normocephalic and atraumatic.  Neck: Neck supple. No thyromegaly present.  Cardiovascular: Normal rate, regular rhythm and normal heart sounds.   Pulmonary/Chest: Effort normal and breath sounds normal.  Lymphadenopathy:    She has no cervical adenopathy.  Neurological: She is alert and oriented to person, place, and time.  Skin: Skin is warm and dry.  Psychiatric: She has a normal mood and affect. Her behavior is normal.          Assessment & Plan:  Depression - Uncontrolled. PHQ - 9 score of 9 which is up from 6 months ago (was 2).  Discussed different options. I think it's reasonable to try to increase her dose of fluoxetine to 40 mg for one month and then see her back to evaluate and hopefully this will be helpful for her to get back to her baseline. Strongly encourage regular exercise to help improve her symptoms as well.  Fatigue - New problem.  Most likely related to increase in depressive symptoms but we will also evaluate further for anemia, thyroid disorder. Given lab slip today and we should have the results on Monday.  Allergic rhinitis-will refill her Veramyst.

## 2012-06-09 ENCOUNTER — Telehealth: Payer: Self-pay

## 2012-06-09 ENCOUNTER — Ambulatory Visit (INDEPENDENT_AMBULATORY_CARE_PROVIDER_SITE_OTHER): Payer: BC Managed Care – PPO | Admitting: Family Medicine

## 2012-06-09 ENCOUNTER — Encounter: Payer: Self-pay | Admitting: Family Medicine

## 2012-06-09 VITALS — BP 103/58 | HR 67 | Temp 98.0°F | Wt 189.0 lb

## 2012-06-09 DIAGNOSIS — J029 Acute pharyngitis, unspecified: Secondary | ICD-10-CM

## 2012-06-09 DIAGNOSIS — J020 Streptococcal pharyngitis: Secondary | ICD-10-CM

## 2012-06-09 MED ORDER — PENICILLIN V POTASSIUM 500 MG PO TABS: ORAL_TABLET | ORAL | Status: AC

## 2012-06-09 NOTE — Telephone Encounter (Signed)
She will need to be seen. We can call an antibiotic without diagnosing her with strep. She is certainly welcome to go to urgent care if she cannot come in during business hours today. They're available next 4 to 8:00 tonight and over the weekend if she would like to be seen.

## 2012-06-09 NOTE — Telephone Encounter (Signed)
Per Dr Linford Arnold she needs an appointment.

## 2012-06-09 NOTE — Progress Notes (Signed)
CC: Jessica Cunningham is a 37 y.o. female is here for Sore Throat   Subjective: HPI:  Patient complains of waking up this morning with a sore throat moderate in severity present with and without swallowing but worse with swallowing. Described as a burning in the back of the throat that's nonradiating. Slightly improved with cold beverages no other interventions. Son diagnosed with strep yesterday. She endorses fatigue worse than baseline since waking today with some subjective chills. Denies fevers, myalgias, joint pain, rashes, headache, motor sensory disturbances, runny nose, nasal congestion, cough, shortness of breath   Review Of Systems Outlined In HPI  Past Medical History  Diagnosis Date  . Depression   . Allergy      No family history on file.   History  Substance Use Topics  . Smoking status: Former Smoker    Quit date: 01/11/2001  . Smokeless tobacco: Not on file  . Alcohol Use:      Objective: Filed Vitals:   06/09/12 1139  BP: 103/58  Pulse: 67  Temp: 98 F (36.7 C)    General: Alert and Oriented, No Acute Distress HEENT: Pupils equal, round, reactive to light. Conjunctivae clear.  External ears unremarkable, canals clear with intact TMs with appropriate landmarks.  Middle ear appears open without effusion. Pink inferior turbinates.  Moist mucous membranes, soft palate and uvula with mild petechiae pharynx without inflammation nor lesions.  Neck supple with shotty anterior chain lymphadenopathy. Midline uvula Lungs: Clear to auscultation bilaterally, no wheezing/ronchi/rales.  Comfortable work of breathing. Good air movement. Cardiac: Regular rate and rhythm. Normal S1/S2.  No murmurs, rubs, nor gallops.   Extremities: No peripheral edema.  Strong peripheral pulses.  Mental Status: No depression, anxiety, nor agitation. Skin: Warm and dry.  Assessment & Plan: Jessica Cunningham was seen today for sore throat.  Diagnoses and associated orders for this visit:  Acute  pharyngitis - POCT rapid strep A  Strep pharyngitis - penicillin v potassium (VEETID) 500 MG tablet; One by mouth every 12 hours for ten days, take 1 hour before or 2 hours after meals.    Exposure and soft palate petechia concerning for strep pharyngitis therefore start penicillin and nonsteroidal anti-inflammatories for pain consider Chloraseptic spray. Urgent reevaluation  Return if symptoms worsen or fail to improve.

## 2012-06-09 NOTE — Telephone Encounter (Signed)
Jessica Cunningham complains of sore throat. Denies fever, chills or sweats. Her son was diagnosed with strep throat. I offered her an appointment. She declined the appointment. NKDA. She wanted me to talk to Dr Linford Arnold.   [facility]

## 2012-06-29 ENCOUNTER — Other Ambulatory Visit: Payer: Self-pay | Admitting: Family Medicine

## 2012-06-30 ENCOUNTER — Other Ambulatory Visit: Payer: Self-pay | Admitting: *Deleted

## 2012-06-30 MED ORDER — FLUOXETINE HCL 40 MG PO CAPS: 40.0000 mg | ORAL_CAPSULE | Freq: Every day | ORAL | Status: DC

## 2012-07-03 ENCOUNTER — Telehealth: Payer: Self-pay | Admitting: *Deleted

## 2012-07-03 DIAGNOSIS — R899 Unspecified abnormal finding in specimens from other organs, systems and tissues: Secondary | ICD-10-CM

## 2012-07-03 NOTE — Telephone Encounter (Signed)
Pt needs recheck on platelets- Barry Dienes, LPN

## 2012-07-04 LAB — CBC
HCT: 38 % (ref 36.0–46.0)
MCH: 28.1 pg (ref 26.0–34.0)
MCHC: 33.2 g/dL (ref 30.0–36.0)
RDW: 13.8 % (ref 11.5–15.5)

## 2012-07-07 ENCOUNTER — Ambulatory Visit (INDEPENDENT_AMBULATORY_CARE_PROVIDER_SITE_OTHER): Payer: BC Managed Care – PPO | Admitting: Family Medicine

## 2012-07-07 ENCOUNTER — Encounter: Payer: Self-pay | Admitting: Family Medicine

## 2012-07-07 VITALS — BP 110/67 | HR 73 | Ht 62.0 in | Wt 189.0 lb

## 2012-07-07 DIAGNOSIS — F341 Dysthymic disorder: Secondary | ICD-10-CM

## 2012-07-07 DIAGNOSIS — F418 Other specified anxiety disorders: Secondary | ICD-10-CM

## 2012-07-07 MED ORDER — VORTIOXETINE HBR 10 MG PO TABS: 10.0000 mg | ORAL_TABLET | Freq: Every day | ORAL | Status: DC

## 2012-07-07 MED ORDER — FLUOXETINE HCL (PMDD) 10 MG PO CAPS: ORAL_CAPSULE | ORAL | Status: DC

## 2012-07-07 MED ORDER — ESOMEPRAZOLE MAGNESIUM 20 MG PO CPDR: DELAYED_RELEASE_CAPSULE | ORAL | Status: DC

## 2012-07-07 NOTE — Progress Notes (Signed)
  Subjective:    Patient ID: Jessica Cunningham, female    DOB: [birth year], 37 y.o.   MRN: [id number]  HPI Depression/anxiety- Has noticed more irritability and snapping more often. She feels really tired.  She didn't feel ike this on zoloft.  Doesn't do well with wellbutrin or Paxil.  She feels excessively tried.   Feels less depressed.   Review of Systems     Objective:   Physical Exam  Constitutional: She is oriented to person, place, and time. She appears well-developed and well-nourished.  HENT:  Head: Normocephalic and atraumatic.  Eyes: Conjunctivae and EOM are normal.  Cardiovascular: Normal rate.   Pulmonary/Chest: Effort normal.  Neurological: She is alert and oriented to person, place, and time.  Skin: Skin is dry. No pallor.  Psychiatric: She has a normal mood and affect. Her behavior is normal.          Assessment & Plan:  Depression/anxiety - PHQ 9 score of 10 today. Overall he thinks she still really struggling. Fatigue is still her biggest issue. She has done well on sertraline in the past but says it comes 2 months for the headaches to go away and really doesn't want to do that again. She has been on the 40 mg of fluoxetine for a month now and still hasn't noticed a major change in her mood. I discussed with her that I really don't think she is clearly not in remission. I think we should discontinue the fluoxetine. Taper written. I would like her to start Brintella.  3 weeks of samples given and coupon card given. If she does the pharmacy and it's not covered and is not reasonable with a coupon card and we can consider trying Effexor instead. I encouraged her to call me if it doesn't go through. She can go ahead and restart her taper off the fluoxetine.

## 2012-07-11 ENCOUNTER — Encounter: Payer: Self-pay | Admitting: Family Medicine

## 2012-07-11 ENCOUNTER — Other Ambulatory Visit: Payer: Self-pay | Admitting: Family Medicine

## 2012-07-11 MED ORDER — SERTRALINE HCL 50 MG PO TABS: ORAL_TABLET | ORAL | Status: DC

## 2012-10-16 ENCOUNTER — Encounter: Payer: Self-pay | Admitting: Family Medicine

## 2012-10-17 ENCOUNTER — Other Ambulatory Visit: Payer: Self-pay | Admitting: Family Medicine

## 2012-10-17 MED ORDER — FLUOXETINE HCL 20 MG PO CAPS: 20.0000 mg | ORAL_CAPSULE | Freq: Every day | ORAL | Status: DC

## 2012-10-17 MED ORDER — SERTRALINE HCL 50 MG PO TABS: 50.0000 mg | ORAL_TABLET | Freq: Every day | ORAL | Status: DC

## 2012-11-16 ENCOUNTER — Other Ambulatory Visit: Payer: Self-pay

## 2012-12-29 ENCOUNTER — Ambulatory Visit (INDEPENDENT_AMBULATORY_CARE_PROVIDER_SITE_OTHER): Payer: BC Managed Care – PPO | Admitting: Family Medicine

## 2012-12-29 ENCOUNTER — Encounter: Payer: Self-pay | Admitting: Family Medicine

## 2012-12-29 VITALS — BP 127/68 | HR 81 | Temp 98.2°F | Ht 62.0 in | Wt 203.0 lb

## 2012-12-29 DIAGNOSIS — F32A Depression, unspecified: Secondary | ICD-10-CM

## 2012-12-29 DIAGNOSIS — Z23 Encounter for immunization: Secondary | ICD-10-CM

## 2012-12-29 DIAGNOSIS — F329 Major depressive disorder, single episode, unspecified: Secondary | ICD-10-CM

## 2012-12-29 MED ORDER — SERTRALINE HCL 50 MG PO TABS: 50.0000 mg | ORAL_TABLET | Freq: Every day | ORAL | Status: DC

## 2012-12-29 MED ORDER — SERTRALINE HCL 50 MG PO TABS: 75.0000 mg | ORAL_TABLET | Freq: Every day | ORAL | Status: DC

## 2012-12-29 NOTE — Progress Notes (Signed)
   Subjective:    Patient ID: Jessica Cunningham, female    DOB: [birth year], 37 y.o.   MRN: [id number]  HPI F/u depression - doing well.  Says increased appetite.  Says doing really well overall. She is happy with this medication. Has been on  it 3 times over her lifetime. It does seem to work well for her over all. She currently denies any side effects..  Says wonders if she will need to be on it for a lifetime.     Review of Systems     Objective:   Physical Exam  Constitutional: She is oriented to person, place, and time. She appears well-developed and well-nourished.  HENT:  Head: Normocephalic and atraumatic.  Cardiovascular: Normal rate, regular rhythm and normal heart sounds.   Pulmonary/Chest: Effort normal and breath sounds normal.  Neurological: She is alert and oriented to person, place, and time.  Skin: Skin is warm and dry.  Psychiatric: She has a normal mood and affect. Her behavior is normal.          Assessment & Plan:  Depression - doing well overall.  PHQ- 9 score of 2. F/U in 6 month. RF sent to pharmacy.

## 2013-01-03 ENCOUNTER — Encounter: Payer: Self-pay | Admitting: Emergency Medicine

## 2013-01-03 ENCOUNTER — Emergency Department
Admission: EM | Admit: 2013-01-03 | Discharge: 2013-01-03 | Disposition: A | Discharge status: Home / Self Care | Payer: BC Managed Care – PPO | Source: Home / Self Care | Attending: Emergency Medicine | Admitting: Emergency Medicine

## 2013-01-03 DIAGNOSIS — J111 Influenza due to unidentified influenza virus with other respiratory manifestations: Secondary | ICD-10-CM

## 2013-01-03 DIAGNOSIS — J209 Acute bronchitis, unspecified: Secondary | ICD-10-CM

## 2013-01-03 DIAGNOSIS — J309 Allergic rhinitis, unspecified: Secondary | ICD-10-CM

## 2013-01-03 LAB — POCT INFLUENZA A/B
Influenza A, POC: NEGATIVE
Influenza B, POC: NEGATIVE

## 2013-01-03 MED ORDER — OSELTAMIVIR PHOSPHATE 75 MG PO CAPS: ORAL_CAPSULE | ORAL | Status: DC

## 2013-01-03 MED ORDER — AZITHROMYCIN 250 MG PO TABS: ORAL_TABLET | ORAL | Status: DC

## 2013-01-03 NOTE — ED Notes (Signed)
Cough, body aches, fever, sore throat x 2 days

## 2013-01-03 NOTE — ED Provider Notes (Signed)
CSN: [id number]     Arrival date & time 01/03/13  1017 History   First MD Initiated Contact with Patient 01/03/13 1019     Chief Complaint  Patient presents with  . Influenza   (Consider location/radiation/quality/duration/timing/severity/associated sxs/prior Treatment) HPI FLU  HPI : Flu symptoms for about 1 day. Fever to 101 with chills, sweats, myalgias, fatigue, headache. Symptoms are progressively worsening, despite trying OTC fever reducing medicine and rest and fluids. Has decreased appetite, but tolerating some liquids by mouth. No history of recent tick bite. Exposed to someone with recent diagnosis of influenza.  Review of Systems: Positive for fatigue, mild nasal congestion, mild sore throat, mild swollen anterior neck glands, moderate cough, mild yellow sputum. Negative for acute vision changes, stiff neck, focal weakness, syncope, seizures, respiratory distress, vomiting, diarrhea, GU symptoms, new Rash. Past Medical History  Diagnosis Date  . Depression   . Allergy    Past Surgical History  Procedure Laterality Date  . Tonsillectomy    . Ltcs      x2   No family history on file. History  Substance Use Topics  . Smoking status: Former Smoker    Quit date: 01/11/2001  . Smokeless tobacco: Not on file  . Alcohol Use: Not on file   OB History   Grav Para Term Preterm Abortions TAB SAB Ect Mult Living                 Review of Systems  Allergies  Paxil; Sertraline; and Wellbutrin  Home Medications   Current Outpatient Rx  Name  Route  Sig  Dispense  Refill  . esomeprazole (NEXIUM) 20 MG capsule      TAKE ONE CAPSULE BY MOUTH EVERY DAY AS NEEDED   30 capsule   1   . fluticasone (VERAMYST) 27.5 MCG/SPRAY nasal spray   Nasal   Place 2 sprays into the nose daily.   10 g   6   . sertraline (ZOLOFT) 50 MG tablet   Oral   Take 1.5 tablets (75 mg total) by mouth daily.   45 tablet   6    BP 129/83  Pulse 87  Temp(Src) 98 F (36.7 C) (Oral)   Ht 5\' 3"  (1.6 m)  Wt 200 lb (90.719 kg)  BMI 35.44 kg/m2  SpO2 96% Physical Exam  Nursing note and vitals reviewed. Constitutional: She appears well-developed and well-nourished.  Non-toxic appearance. She appears ill (very fatigued, but no cardiorespiratory distress). No distress.  HENT:  Head: Normocephalic and atraumatic.  Right Ear: Tympanic membrane and external ear normal.  Left Ear: Tympanic membrane and external ear normal.  Nose: Rhinorrhea present.  Mouth/Throat: Mucous membranes are normal. Posterior oropharyngeal erythema (mild redness ) present. No oropharyngeal exudate.  Eyes: Conjunctivae are normal. Right eye exhibits no discharge. Left eye exhibits no discharge. No scleral icterus.  Neck: Neck supple.  Cardiovascular: Normal rate, regular rhythm and normal heart sounds.   Pulmonary/Chest: No stridor. No respiratory distress. She has no decreased breath sounds. She has no wheezes. She has rhonchi (anterior). She has no rales.  Occasional hacking cough noted.  Abdominal: Soft. There is no tenderness.  Musculoskeletal: She exhibits no edema.  Lymphadenopathy:    She has cervical adenopathy (mild shoddy anterior cervical nodes).  Neurological: She is alert.  Skin: Skin is warm and intact. No rash noted. She is diaphoretic.  Psychiatric: She has a normal mood and affect.    ED Course  Procedures (including critical care time) Labs Review  Labs Reviewed  POCT INFLUENZA A/B   Imaging Review No results found.  EKG Interpretation    Date/Time:    Ventricular Rate:    PR Interval:    QRS Duration:   QT Interval:    QTC Calculation:   R Axis:     Text Interpretation:              MDM    Rapid flu test for influenza A and B. are both negative. Clinically, diagnosis is acute influenza-like illness. It still possible that she has a true influenza. Also, clinically has acute bronchitis. Risks, benefits, alternatives discussed. Tamiflu prescribed 75 twice  a day x5 days. Z-Pak for bacterial coverage. Other symptomatic care discussed Followup with PCP if no better one week, sooner if worse or new symptoms  Precautions discussed. Red flags discussed. Questions invited and answered. Patient voiced understanding and agreement.      Lajean Manes, MD 01/03/13 1341

## 2013-01-29 ENCOUNTER — Other Ambulatory Visit: Payer: Self-pay | Admitting: Family Medicine

## 2013-03-27 ENCOUNTER — Encounter: Payer: Self-pay | Admitting: Sports Medicine

## 2013-03-27 ENCOUNTER — Ambulatory Visit (INDEPENDENT_AMBULATORY_CARE_PROVIDER_SITE_OTHER): Payer: BC Managed Care – PPO | Admitting: Sports Medicine

## 2013-03-27 VITALS — BP 114/79 | HR 74 | Ht 62.0 in | Wt 202.0 lb

## 2013-03-27 DIAGNOSIS — J0100 Acute maxillary sinusitis, unspecified: Secondary | ICD-10-CM | POA: Insufficient documentation

## 2013-03-27 MED ORDER — AZITHROMYCIN 250 MG PO TABS: ORAL_TABLET | ORAL | Status: DC

## 2013-03-27 MED ORDER — BECLOMETHASONE DIPROPIONATE 80 MCG/ACT NA AERS: 1.0000 | INHALATION_SPRAY | Freq: Every day | NASAL | Status: DC

## 2013-03-27 NOTE — Progress Notes (Signed)
  Subjective:    CC: Sinus pain  HPI: Patient is a pleasant 38 yo female who presents with sinus pain for one week. She has had pain in the frontal and maxillary sinuses, runny nose, ans sore throat. The symptoms have not been improving. She has tried Aleve cold and sinus, Veramist, and Allegra with temporary relief. Denies cough, ear pain, changes in vision.   Past medical history, Surgical history, Family history not pertinant except as noted below, Social history, Allergies, and medications have been entered into the medical record, reviewed, and no changes needed.   Review of Systems: No fevers, chills, night sweats, weight loss, chest pain, or shortness of breath.   Objective:    General: Well Developed, well nourished, and in no acute distress.  Neuro: Alert and oriented x3, extra-ocular muscles intact, sensation grossly intact.  HEENT: Normocephalic, atraumatic, pupils equal round reactive to light, neck supple, no masses, no lymphadenopathy, thyroid nonpalpable. Tender to palpation over maxillary and frontal sinuses.  Skin: Warm and dry, no rashes. Cardiac: Regular rate and rhythm, no murmurs rubs or gallops, no lower extremity edema.  Respiratory: Clear to auscultation bilaterally. Not using accessory muscles, speaking in full sentences.   Impression and Recommendations:

## 2013-03-27 NOTE — Assessment & Plan Note (Signed)
Switching to QNasl, azithromycin. Return as needed.

## 2013-04-23 ENCOUNTER — Emergency Department
Admission: EM | Admit: 2013-04-23 | Discharge: 2013-04-23 | Disposition: A | Discharge status: Home / Self Care | Payer: BC Managed Care – PPO | Source: Home / Self Care | Attending: Emergency Medicine | Admitting: Emergency Medicine

## 2013-04-23 ENCOUNTER — Encounter: Payer: Self-pay | Admitting: Emergency Medicine

## 2013-04-23 ENCOUNTER — Emergency Department (INDEPENDENT_AMBULATORY_CARE_PROVIDER_SITE_OTHER): Payer: BC Managed Care – PPO

## 2013-04-23 DIAGNOSIS — M79609 Pain in unspecified limb: Secondary | ICD-10-CM

## 2013-04-23 DIAGNOSIS — S60229A Contusion of unspecified hand, initial encounter: Secondary | ICD-10-CM

## 2013-04-23 DIAGNOSIS — S60222A Contusion of left hand, initial encounter: Secondary | ICD-10-CM

## 2013-04-23 HISTORY — DX: Hyperlipidemia, unspecified: E78.5

## 2013-04-23 NOTE — ED Notes (Signed)
Pt c/o LT hand injury x 2wks ago, post fall. She has taken aleve intermittently with some relief.

## 2013-04-23 NOTE — ED Provider Notes (Signed)
CSN: [id number]     Arrival date & time 04/23/13  1807 History   First MD Initiated Contact with Patient 04/23/13 1820     Chief Complaint  Patient presents with  . Hand Injury    Patient is a 38 y.o. female presenting with hand injury. The history is provided by the patient.  Hand Injury Location:  Hand Time since incident:  2 weeks Injury: yes   Mechanism of injury comment:  Accidentally fell, landing on the lateral left hand to brace her fall Hand location:  L hand Pain details:    Quality:  Sharp   Radiates to:  Does not radiate   Severity:  Moderate (3/10)   Timing:  Constant   Progression:  Unchanged Chronicity:  New Handedness:  Right-handed Foreign body present:  No foreign bodies Prior injury to area:  No Relieved by: Aleve helped somewhat. Associated symptoms: decreased range of motion, stiffness and swelling   Associated symptoms: no back pain, no fatigue, no fever, no muscle weakness, no neck pain, no numbness and no tingling   Risk factors: no concern for non-accidental trauma, no known bone disorder, no frequent fractures and no recent illness     Past Medical History  Diagnosis Date  . Depression   . Allergy   . Hyperlipidemia    Past Surgical History  Procedure Laterality Date  . Tonsillectomy    . Ltcs      x2  . Cesarean section     History reviewed. No pertinent family history. History  Substance Use Topics  . Smoking status: Former Smoker    Quit date: 01/11/2001  . Smokeless tobacco: Not on file  . Alcohol Use: Yes   OB History   Grav Para Term Preterm Abortions TAB SAB Ect Mult Living                 Review of Systems  Constitutional: Negative for fever and fatigue.  Musculoskeletal: Positive for stiffness. Negative for back pain and neck pain.  All other systems reviewed and are negative.   Allergies  Paxil; Sertraline; and Wellbutrin  Home Medications   Current Outpatient Rx  Name  Route  Sig  Dispense  Refill  .  fexofenadine (ALLEGRA) 180 MG tablet   Oral   Take 180 mg by mouth daily.         Marland Kitchen. azithromycin (ZITHROMAX Z-PAK) 250 MG tablet      Take 2 tablets (500 mg) on  Day 1,  followed by 1 tablet (250 mg) once daily on Days 2 through 5.   6 each   0   . Beclomethasone Dipropionate (QNASL) 80 MCG/ACT AERS   Each Nare   Place 1 spray into both nostrils daily.   1 Inhaler   11   . NEXIUM 20 MG capsule      TAKE ONE CAPSULE BY MOUTH EVERY DAY AS NEEDED   30 capsule   1   . sertraline (ZOLOFT) 50 MG tablet   Oral   Take 1.5 tablets (75 mg total) by mouth daily.   45 tablet   6    BP 130/83  Pulse 80  Temp(Src) 98.2 F (36.8 C) (Oral)  Resp 18  Ht 5' 2.5" (1.588 m)  Wt 202 lb (91.627 kg)  BMI 36.33 kg/m2  SpO2 97%  LMP 04/13/2013 Physical Exam  Nursing note and vitals reviewed. Constitutional: She is oriented to person, place, and time. She appears well-developed and well-nourished. No distress.  HENT:  Head: Normocephalic and atraumatic.  Eyes: Conjunctivae and EOM are normal. Pupils are equal, round, and reactive to light. No scleral icterus.  Neck: Normal range of motion.  Cardiovascular: Normal rate.   Pulmonary/Chest: Effort normal.  Abdominal: She exhibits no distension.  Musculoskeletal:       Left wrist: Normal. She exhibits normal range of motion, no bony tenderness, no swelling and no deformity.       Left hand: She exhibits decreased range of motion, bony tenderness and swelling. She exhibits normal capillary refill, no deformity and no laceration. Normal sensation noted. Normal strength noted.       Hands: Swollen and tender left fifth MCPJ area, as depicted. No skin abnormality. Decreased range of motion. Tendons tested, intact. No ligamentous instability. Neurovascular distally intact  Neurological: She is alert and oriented to person, place, and time.  Skin: Skin is warm.  Psychiatric: She has a normal mood and affect.    ED Course  Procedures  (including critical care time) Labs Review Labs Reviewed - No data to display Imaging Review Dg Hand Complete Left  04/23/2013   CLINICAL DATA:  Fall with left hand pain in the fifth metacarpal region.  EXAM: LEFT HAND - COMPLETE 3+ VIEW  COMPARISON:  None.  FINDINGS: There is no evidence of fracture or dislocation. There is no evidence of arthropathy or other focal bone abnormality. Soft tissues are unremarkable.  IMPRESSION: No acute fracture.   Electronically Signed   By: Irish LackGlenn  Yamagata M.D.   On: 04/23/2013 19:03     MDM   1. Contusion of left hand    X-ray negative.--Discussed with patient and gave her a printed copy of x-ray report. Treatment options discussed, as well as risks, benefits, alternatives. Patient voiced understanding and agreement with the following plans: Ace bandage applied. She declined prescription pain med, she prefers to use ibuprofen when necessary Other advice given.--Gradually increase passive range of motion over the next few days and then progress to further range of motion. Followup with PCP or orthopedist if not completely better in one to 2 weeks, sooner if worse or new symptoms. Precautions discussed. Red flags discussed. Questions invited and answered. Patient voiced understanding and agreement.     Lajean Manesavid Massey, MD 04/23/13 [phone]

## 2013-04-30 ENCOUNTER — Ambulatory Visit (INDEPENDENT_AMBULATORY_CARE_PROVIDER_SITE_OTHER): Payer: BC Managed Care – PPO | Admitting: Physician Assistant

## 2013-04-30 ENCOUNTER — Encounter: Payer: Self-pay | Admitting: Physician Assistant

## 2013-04-30 VITALS — BP 106/73 | HR 91 | Temp 98.2°F | Ht 62.0 in | Wt 202.0 lb

## 2013-04-30 DIAGNOSIS — J209 Acute bronchitis, unspecified: Secondary | ICD-10-CM

## 2013-04-30 MED ORDER — AZITHROMYCIN 250 MG PO TABS: ORAL_TABLET | ORAL | Status: DC

## 2013-04-30 NOTE — Progress Notes (Signed)
   Subjective:    Patient ID: Jessica Cunningham, female    DOB: Nov 18, 1975, 38 y.o.   MRN: [id number]  HPI Pt is a 38 yo female who presents to the clinic with 5 days of productive cough. She had a low grade fever Saturday night of 100. Cough is productive with yellow sputum. She also had some sinus pressure and facial pain for 2-3 days which cleared with mucinex. Aleve has helped feel some better. She feels like she hears crackling in chest and feels like her chest is tight. No one else is sick. No ear pain or ST.       Review of Systems     Objective:   Physical Exam  Constitutional: She is oriented to person, place, and time. She appears well-developed and well-nourished.  HENT:  Head: Normocephalic and atraumatic.  Right Ear: External ear normal.  Left Ear: External ear normal.  Mouth/Throat: Oropharynx is clear and moist. No oropharyngeal exudate.  TM's clear bilaterally.  Bilateral nasal turbinates red and swollen.  Negative for maxillary or frontal sinus pressure.   Eyes: Conjunctivae are normal. Right eye exhibits no discharge. Left eye exhibits no discharge.  Neck: Normal range of motion. Neck supple.  Cardiovascular: Normal rate, regular rhythm and normal heart sounds.   Pulmonary/Chest: Effort normal and breath sounds normal. She has no wheezes.  No rhonchi.  Lymphadenopathy:    She has no cervical adenopathy.  Neurological: She is alert and oriented to person, place, and time.  Skin: Skin is dry.  Psychiatric: She has a normal mood and affect. Her behavior is normal.          Assessment & Plan:  Acute bronchitis- peak flows were in the green zone. Treated with zpak. Gave HO for other symptomatic care. Encouraged to continue mucinex twice a day. deslym at night.  Stay hydrated. Discussed shot of solumedrol but pt has never had steroids so opted to wait. If not improving may consider. Continue using qnasl.

## 2013-04-30 NOTE — Patient Instructions (Signed)
Acute Bronchitis Bronchitis is inflammation of the airways that extend from the windpipe into the lungs (bronchi). The inflammation often causes mucus to develop. This leads to a cough, which is the most common symptom of bronchitis.  In acute bronchitis, the condition usually develops suddenly and goes away over time, usually in a couple weeks. Smoking, allergies, and asthma can make bronchitis worse. Repeated episodes of bronchitis may cause further lung problems.  CAUSES Acute bronchitis is most often caused by the same virus that causes a cold. The virus can spread from person to person (contagious).  SIGNS AND SYMPTOMS   Cough.   Fever.   Coughing up mucus.   Body aches.   Chest congestion.   Chills.   Shortness of breath.   Sore throat.  DIAGNOSIS  Acute bronchitis is usually diagnosed through a physical exam. Tests, such as chest X-rays, are sometimes done to rule out other conditions.  TREATMENT  Acute bronchitis usually goes away in a couple weeks. Often times, no medical treatment is necessary. Medicines are sometimes given for relief of fever or cough. Antibiotics are usually not needed but may be prescribed in certain situations. In some cases, an inhaler may be recommended to help reduce shortness of breath and control the cough. A cool mist vaporizer may also be used to help thin bronchial secretions and make it easier to clear the chest.  HOME CARE INSTRUCTIONS  Get plenty of rest.   Drink enough fluids to keep your urine clear or pale yellow (unless you have a medical condition that requires fluid restriction). Increasing fluids may help thin your secretions and will prevent dehydration.   Only take over-the-counter or prescription medicines as directed by your health care provider.   Avoid smoking and secondhand smoke. Exposure to cigarette smoke or irritating chemicals will make bronchitis worse. If you are a smoker, consider using nicotine gum or skin  patches to help control withdrawal symptoms. Quitting smoking will help your lungs heal faster.   Reduce the chances of another bout of acute bronchitis by washing your hands frequently, avoiding people with cold symptoms, and trying not to touch your hands to your mouth, nose, or eyes.   Follow up with your health care provider as directed.  SEEK MEDICAL CARE IF: Your symptoms do not improve after 1 week of treatment.  SEEK IMMEDIATE MEDICAL CARE IF:  You develop an increased fever or chills.   You have chest pain.   You have severe shortness of breath.  You have bloody sputum.   You develop dehydration.  You develop fainting.  You develop repeated vomiting.  You develop a severe headache. MAKE SURE YOU:   Understand these instructions.  Will watch your condition.  Will get help right away if you are not doing well or get worse. Document Released: 02/05/2004 Document Revised: 08/30/2012 Document Reviewed: 06/20/2012 [employer] Patient Information 2014 [employer].  

## 2013-05-03 ENCOUNTER — Other Ambulatory Visit: Payer: Self-pay | Admitting: Family Medicine

## 2013-05-03 MED ORDER — MONTELUKAST SODIUM 10 MG PO TABS: 10.0000 mg | ORAL_TABLET | Freq: Every day | ORAL | Status: DC

## 2013-05-15 ENCOUNTER — Ambulatory Visit (INDEPENDENT_AMBULATORY_CARE_PROVIDER_SITE_OTHER): Payer: BC Managed Care – PPO | Admitting: Family Medicine

## 2013-05-15 ENCOUNTER — Encounter: Payer: Self-pay | Admitting: Family Medicine

## 2013-05-15 VITALS — BP 122/83 | HR 78 | Temp 97.6°F | Wt 200.0 lb

## 2013-05-15 DIAGNOSIS — R519 Headache, unspecified: Secondary | ICD-10-CM

## 2013-05-15 DIAGNOSIS — R51 Headache: Secondary | ICD-10-CM

## 2013-05-15 DIAGNOSIS — J019 Acute sinusitis, unspecified: Secondary | ICD-10-CM

## 2013-05-15 MED ORDER — AMOXICILLIN-POT CLAVULANATE 875-125 MG PO TABS: 1.0000 | ORAL_TABLET | Freq: Two times a day (BID) | ORAL | Status: DC

## 2013-05-15 NOTE — Patient Instructions (Signed)
Call if not feeling at least 40% better in about 5-6 days.

## 2013-05-15 NOTE — Progress Notes (Signed)
   Subjective:    Patient ID: Jessica Cunningham, female    DOB: November 23, 1975, 38 y.o.   MRN: [id number]  HPI Has been having headaches that start over the nasal bridge and radiating up towards the top of her head. Fever to 100.7 this morning. She took some Tylenol. The nausea last night. No visual changes. She was seen about 2 weeks ago for acute bronchitis and put on azithromycin. She is better but still has a cough.  Currently on Allegra, Singulair and she nasal to treat her allergies. Unfortunately she tends to get recurrent sinus infections in the spring. One year she ended up seeing ear nose and throat specialist and was on 5 rounds of antibiotics before she was completely better.  Review of Systems     Objective:   Physical Exam  Constitutional: She is oriented to person, place, and time. She appears well-developed and well-nourished.  HENT:  Head: Normocephalic and atraumatic.  Right Ear: External ear normal.  Left Ear: External ear normal.  Nose: Nose normal.  Mouth/Throat: Oropharynx is clear and moist.  TMs and canals are clear.   Eyes: Conjunctivae and EOM are normal. Pupils are equal, round, and reactive to light.  Neck: Neck supple. No thyromegaly present.  Cardiovascular: Normal rate, regular rhythm and normal heart sounds.   Pulmonary/Chest: Effort normal and breath sounds normal. She has no wheezes.  Lymphadenopathy:    She has no cervical adenopathy.  Neurological: She is alert and oriented to person, place, and time.  Skin: Skin is warm and dry.  Psychiatric: She has a normal mood and affect.          Assessment & Plan:  Acute sinusitis-suspect that this is a bacterial infection secondary to her recent bronchitis. She did just complete a course of azithromycin about a week ago. Will treat with Augmentin for broader spectrum coverage. If not at least 40% better in the next 5-6 days then please call the office back and consider switching to a fork when alone. Consider  adding nasal rinses to her regimen of atenolol and histamine, nasal steroid spray, and Singulair. Also consider referral to an allergist for formal allergy testing and possible immunotherapy. She said she will think about it. Again recommend switching to an NSAID consider Tylenol for additional pain relief for her headaches and fever control.

## 2013-05-24 ENCOUNTER — Encounter: Payer: Self-pay | Admitting: Family Medicine

## 2013-05-25 ENCOUNTER — Other Ambulatory Visit: Payer: Self-pay | Admitting: Family Medicine

## 2013-05-25 MED ORDER — AMOXICILLIN-POT CLAVULANATE 875-125 MG PO TABS: 1.0000 | ORAL_TABLET | Freq: Two times a day (BID) | ORAL | Status: DC

## 2013-06-18 ENCOUNTER — Other Ambulatory Visit: Payer: Self-pay | Admitting: Family Medicine

## 2013-06-21 ENCOUNTER — Telehealth: Payer: Self-pay

## 2013-06-21 NOTE — Telephone Encounter (Signed)
[employer] has denied prior-auth for esomeprazole magnesium. The prescription has to be processed as brand name Nexium. I called the pharmacy and they tried to run it through as brand Nexium and it was accepted.    Explanation of determination is in your box.

## 2013-07-27 ENCOUNTER — Ambulatory Visit (INDEPENDENT_AMBULATORY_CARE_PROVIDER_SITE_OTHER): Payer: BC Managed Care – PPO | Admitting: Family Medicine

## 2013-07-27 ENCOUNTER — Encounter: Payer: Self-pay | Admitting: Family Medicine

## 2013-07-27 VITALS — BP 112/71 | HR 82 | Temp 98.1°F | Wt 204.0 lb

## 2013-07-27 DIAGNOSIS — J069 Acute upper respiratory infection, unspecified: Secondary | ICD-10-CM

## 2013-07-27 NOTE — Progress Notes (Signed)
   Subjective:    Patient ID: Jessica Cunningham, female    DOB: October 11, 1975, 38 y.o.   MRN: [id number]  HPI ST on Monday night that was severe. Felt fatigued adn had some diarrhea. Then started to develop cogestion in nose and having a lot of post nasal drip. No fever, chills or sweats.  Has tried OTC Advil Sinus and nasal rinse.   Using vermyst.     Review of Systems     Objective:   Physical Exam  Constitutional: She is oriented to person, place, and time. She appears well-developed and well-nourished.  HENT:  Head: Normocephalic and atraumatic.  Right Ear: External ear normal.  Left Ear: External ear normal.  Nose: Nose normal.  Mouth/Throat: Oropharynx is clear and moist.  TMs and canals are clear.   Eyes: Conjunctivae and EOM are normal. Pupils are equal, round, and reactive to light.  Neck: Neck supple. No thyromegaly present.  Cardiovascular: Normal rate, regular rhythm and normal heart sounds.   Pulmonary/Chest: Effort normal and breath sounds normal. She has no wheezes.  Lymphadenopathy:    She has no cervical adenopathy.  Neurological: She is alert and oriented to person, place, and time.  Skin: Skin is warm and dry.  Psychiatric: She has a normal mood and affect. Her behavior is normal.          Assessment & Plan:  URI - Gave reassurance. Recommend symptomatic care. Call next week if not better.

## 2013-07-27 NOTE — Patient Instructions (Signed)
Call next week if not better.

## 2013-07-28 ENCOUNTER — Emergency Department
Admission: EM | Admit: 2013-07-28 | Discharge: 2013-07-28 | Disposition: A | Discharge status: Home / Self Care | Payer: BC Managed Care – PPO | Source: Home / Self Care | Attending: Emergency Medicine | Admitting: Emergency Medicine

## 2013-07-28 ENCOUNTER — Encounter: Payer: Self-pay | Admitting: Emergency Medicine

## 2013-07-28 DIAGNOSIS — H66009 Acute suppurative otitis media without spontaneous rupture of ear drum, unspecified ear: Secondary | ICD-10-CM

## 2013-07-28 DIAGNOSIS — J0101 Acute recurrent maxillary sinusitis: Secondary | ICD-10-CM

## 2013-07-28 DIAGNOSIS — J0100 Acute maxillary sinusitis, unspecified: Secondary | ICD-10-CM

## 2013-07-28 DIAGNOSIS — H66002 Acute suppurative otitis media without spontaneous rupture of ear drum, left ear: Secondary | ICD-10-CM

## 2013-07-28 LAB — POCT RAPID STREP A (OFFICE): Rapid Strep A Screen: NEGATIVE

## 2013-07-28 MED ORDER — FLUCONAZOLE 150 MG PO TABS: 150.0000 mg | ORAL_TABLET | Freq: Once | ORAL | Status: DC

## 2013-07-28 MED ORDER — CEFTRIAXONE SODIUM 1 G IJ SOLR
1.0000 g | INTRAMUSCULAR | Status: AC
  Administered 2013-07-28: 1 g via INTRAMUSCULAR

## 2013-07-28 MED ORDER — CEFDINIR 300 MG PO CAPS: 300.0000 mg | ORAL_CAPSULE | Freq: Two times a day (BID) | ORAL | Status: DC

## 2013-07-28 MED ORDER — HYDROCODONE-ACETAMINOPHEN 5-325 MG PO TABS: 1.0000 | ORAL_TABLET | ORAL | Status: DC | PRN

## 2013-07-28 NOTE — ED Notes (Signed)
Reports starting with sore throat 5 days ago which progressed to nasal congestion, facial/sinus pain, and left ear pain. Was seen yesterday by Dr. Linford ArnoldMetheney and determined it to be viral, but is even more uncomfortable today.

## 2013-07-29 NOTE — ED Provider Notes (Signed)
CSN: [id number]     Arrival date & time 07/28/13  7829 History   First MD Initiated Contact with Patient 07/28/13 1036     Chief Complaint  Patient presents with  . Facial Pain  . Otalgia  . Sore Throat  . Nasal Congestion   (Consider location/radiation/quality/duration/timing/severity/associated sxs/prior Treatment) HPI Reports starting with sore throat 5 days ago which progressed to nasal congestion, facial/sinus pain, and left ear pain. Tried OTC treatment without relief. Past Medical History  Diagnosis Date  . Depression   . Allergy   . Hyperlipidemia    Past Surgical History  Procedure Laterality Date  . Tonsillectomy    . Ltcs      x2  . Cesarean section     History reviewed. No pertinent family history. History  Substance Use Topics  . Smoking status: Former Smoker    Quit date: 01/11/2001  . Smokeless tobacco: Not on file  . Alcohol Use: Yes   OB History   Grav Para Term Preterm Abortions TAB SAB Ect Mult Living                 Review of Systems  All other systems reviewed and are negative.   Allergies  Paxil; Sertraline; and Wellbutrin  Home Medications   Prior to Admission medications   Medication Sig Start Date End Date Taking? Authorizing Provider  Beclomethasone Dipropionate (QNASL) 80 MCG/ACT AERS Place 1 spray into both nostrils daily. 03/27/13   Monica Becton, MD  cefdinir (OMNICEF) 300 MG capsule Take 1 capsule (300 mg total) by mouth 2 (two) times daily. X 10 days 07/28/13   Lajean Manes, MD  fexofenadine (ALLEGRA) 180 MG tablet Take 180 mg by mouth daily.    Historical Provider, MD  fluconazole (DIFLUCAN) 150 MG tablet Take 1 tablet (150 mg total) by mouth once. Take 1 now, then may repeat x1 in 4 days, for yeast infection. 07/28/13   Lajean Manes, MD  HYDROcodone-acetaminophen (NORCO/VICODIN) 5-325 MG per tablet Take 1-2 tablets by mouth every 4 (four) hours as needed for severe pain. Take with food. 07/28/13   Lajean Manes, MD   montelukast (SINGULAIR) 10 MG tablet Take 1 tablet (10 mg total) by mouth at bedtime. 05/03/13   Agapito Games, MD  NEXIUM 20 MG capsule TAKE ONE CAPSULE BY MOUTH EVERY DAY AS NEEDED 06/18/13   Agapito Games, MD  sertraline (ZOLOFT) 50 MG tablet Take 1.5 tablets (75 mg total) by mouth daily. 12/29/12   Agapito Games, MD   BP 113/76  Pulse 92  Temp(Src) 98.4 F (36.9 C) (Oral)  Resp 16  Ht 5\' 2"  (1.575 m)  Wt 204 lb (92.534 kg)  BMI 37.30 kg/m2  SpO2 96% Physical Exam  Nursing note and vitals reviewed. Constitutional: She is oriented to person, place, and time. She appears well-developed and well-nourished. No distress.  HENT:  Head: Normocephalic and atraumatic.  Right Ear: Tympanic membrane, external ear and ear canal normal.  Left Ear: Tympanic membrane and ear canal normal.  Nose: Mucosal edema and rhinorrhea present. Right sinus exhibits maxillary sinus tenderness. Left sinus exhibits maxillary sinus tenderness.  Mouth/Throat: No oral lesions. No oropharyngeal exudate.  Left TM red and distorted, mildly bulging, mild air-fluid level. TM intact. Left external canal normal. No drainage.  Pharynx with red without tonsillar enlargement or exudate  Eyes: Right eye exhibits no discharge. Left eye exhibits no discharge. No scleral icterus.  Neck: Neck supple.  Cardiovascular: Normal rate, regular rhythm and  normal heart sounds.   Pulmonary/Chest: Effort normal and breath sounds normal. She has no wheezes. She has no rales.  Lymphadenopathy:    She has no cervical adenopathy.  Neurological: She is alert and oriented to person, place, and time.  Skin: Skin is warm and dry.    ED Course  Procedures (including critical care time) Labs Review Labs Reviewed  POCT RAPID STREP A (OFFICE)    Imaging Review No results found.   MDM   1. Left acute suppurative otitis media   2. Acute recurrent maxillary sinusitis    Rapid strep test negative. Treatment options  discussed, as well as risks, benefits, alternatives. --Reviewed antibiotic options. She was recently on Augmentin, so she declined that option. She declined Zithromax or Levaquin. Patient voiced understanding and agreement with the following plans: Prescribed Omnicef 300 twice a day x10 days Diflucan at her request, and case she gets yeast infection from the abx. Mucinex D She declined options of nasal steroid.  We discussed other symptomatic care. Followup with PCP if no better one week, sooner if worse or new symptoms      Lajean Manesavid Massey, MD 07/29/13 1354

## 2013-07-30 ENCOUNTER — Other Ambulatory Visit: Payer: Self-pay | Admitting: Family Medicine

## 2013-08-02 ENCOUNTER — Telehealth: Payer: Self-pay | Admitting: *Deleted

## 2013-08-02 MED ORDER — PREDNISONE 20 MG PO TABS: 40.0000 mg | ORAL_TABLET | Freq: Every day | ORAL | Status: DC

## 2013-08-02 NOTE — Telephone Encounter (Signed)
Make sure not taking any aspirin products or Aleve, motrin as can cause ear ringing.  Let's cotinue with the omnicef and add oral prednisone to start.  I can see her in AM if would like. She can come in at 9:15.

## 2013-08-02 NOTE — Telephone Encounter (Signed)
Pt informed and stated that she is worried about the ringing. She was told by the Dr. In the UC that she was given one of the stronger abx. She is open to trying another abx if you feel that it would be beneficial or she can just wait until Monday and f/u with ENT.Laureen Ochs.Mynor Witkop, Viann Shoveonya [name]

## 2013-08-02 NOTE — Telephone Encounter (Signed)
We can always switch antibiotics if she feels she is only getting partial response.  We can also try a nasal steroid spray to reduce swelling in the sinuses

## 2013-08-02 NOTE — Telephone Encounter (Signed)
Pt called and stated that she was seen by Dr. Linford ArnoldMetheney last Friday for congestion and sinusitis and to f/u if needed. She stated the next day she had to go to UC because her Lymph node on the L side was swollen and tight and her ear was painful. She was told that she had an ear infection, and sinus infection she was given 1G of rocephin and cefdinir 300 BID x 10 days. She has been on the ABX for 5 days now and her ears are ringing she denies any pain but she states that they feel clogged up. She stated that she called her children's ENT office and scheduled an appt for Monday she wanted to know if Dr. Linford ArnoldMetheney had any thoughts/suggestions. Laureen Ochs.Tahja Liao, Viann Shoveonya [name]

## 2013-08-03 ENCOUNTER — Other Ambulatory Visit: Payer: Self-pay | Admitting: Family Medicine

## 2013-08-03 NOTE — Telephone Encounter (Signed)
Pt informed. Told that she can take tylenol for pain. She could not come in today told to keep appt w/ENT for Monday.Loralee PacasBarkley, Cesilia Shinn [place]Lynetta

## 2013-08-23 ENCOUNTER — Other Ambulatory Visit: Payer: Self-pay | Admitting: Family Medicine

## 2013-08-30 ENCOUNTER — Other Ambulatory Visit: Payer: Self-pay | Admitting: Family Medicine

## 2013-09-27 ENCOUNTER — Other Ambulatory Visit: Payer: Self-pay | Admitting: Family Medicine

## 2013-09-30 ENCOUNTER — Other Ambulatory Visit: Payer: Self-pay | Admitting: Family Medicine

## 2013-12-11 ENCOUNTER — Other Ambulatory Visit: Payer: Self-pay | Admitting: Family Medicine

## 2013-12-24 ENCOUNTER — Other Ambulatory Visit: Payer: Self-pay | Admitting: Family Medicine

## 2013-12-28 ENCOUNTER — Other Ambulatory Visit: Payer: Self-pay | Admitting: Family Medicine

## 2013-12-30 ENCOUNTER — Other Ambulatory Visit: Payer: Self-pay | Admitting: Family Medicine

## 2013-12-31 MED ORDER — SERTRALINE HCL 50 MG PO TABS: 50.0000 mg | ORAL_TABLET | Freq: Every day | ORAL | Status: DC

## 2014-01-14 ENCOUNTER — Ambulatory Visit (INDEPENDENT_AMBULATORY_CARE_PROVIDER_SITE_OTHER): Payer: BLUE CROSS/BLUE SHIELD | Admitting: Physician Assistant

## 2014-01-14 ENCOUNTER — Encounter: Payer: Self-pay | Admitting: Physician Assistant

## 2014-01-14 VITALS — BP 125/73 | HR 84 | Ht 62.0 in | Wt 209.0 lb

## 2014-01-14 DIAGNOSIS — F329 Major depressive disorder, single episode, unspecified: Secondary | ICD-10-CM | POA: Diagnosis not present

## 2014-01-14 DIAGNOSIS — A499 Bacterial infection, unspecified: Secondary | ICD-10-CM

## 2014-01-14 DIAGNOSIS — F32A Depression, unspecified: Secondary | ICD-10-CM

## 2014-01-14 DIAGNOSIS — M7711 Lateral epicondylitis, right elbow: Secondary | ICD-10-CM | POA: Insufficient documentation

## 2014-01-14 DIAGNOSIS — H109 Unspecified conjunctivitis: Secondary | ICD-10-CM

## 2014-01-14 DIAGNOSIS — H1089 Other conjunctivitis: Secondary | ICD-10-CM

## 2014-01-14 MED ORDER — SERTRALINE HCL 50 MG PO TABS: ORAL_TABLET | ORAL | Status: DC

## 2014-01-14 MED ORDER — POLYMYXIN B-TRIMETHOPRIM 10000-0.1 UNIT/ML-% OP SOLN: 1.0000 [drp] | OPHTHALMIC | Status: DC

## 2014-01-14 MED ORDER — MELOXICAM 15 MG PO TABS: 15.0000 mg | ORAL_TABLET | Freq: Every day | ORAL | Status: DC

## 2014-01-14 NOTE — Progress Notes (Signed)
   Subjective:    Patient ID: Jessica Cunningham, female    DOB: 07-07-1975, 39 y.o.   MRN: [id number]  HPI  Pt presents to the clinic with multiple concerns today.   This morning her right eye was matted shut with green discharge. Mild discomfort. No vision changes. No known trauma. No fever, chills, sinus pressure, or ST. Not tried anything to make better. Denies any itching.   Her right elbow and into right forearm hurts today as well. Started about 1 and 1/2 week ago after a sudden twist of her forearm. Worse when she tries to squeeze or grab things. No trauma. No knew task or lifting weights. Not tried anything to make better.   She would like refill on zoloft. She takes 1 and 1/2 tablets daily. Doing great and no concerns.   Review of Systems  All other systems reviewed and are negative.      Objective:   Physical Exam  Constitutional: She is oriented to person, place, and time. She appears well-developed and well-nourished.  HENT:  Head: Normocephalic and atraumatic.  Right Ear: External ear normal.  Left Ear: External ear normal.  Nose: Nose normal.  Mouth/Throat: Oropharynx is clear and moist.  Eyes:  Right injected conjunctiva with no active discharge.   Neck: Normal range of motion. Neck supple.  Cardiovascular: Normal rate, regular rhythm and normal heart sounds.   Pulmonary/Chest: Effort normal and breath sounds normal.  Musculoskeletal:  Tenderness to palpation over right lateral epicondyle and into forearm.  Negative finklestein, right.  Hand grip decreased due to pain 3/5.  Pain with supination.   Lymphadenopathy:    She has no cervical adenopathy.  Neurological: She is alert and oriented to person, place, and time.  Skin: Skin is dry.  Psychiatric: She has a normal mood and affect. Her behavior is normal.          Assessment & Plan:  Bacterial conjunctivitis, right- treated with polytrim for 7 days. HO given for prevention of spread. Follow up if not  improving.   Lateral right epicondylitis- HO given. Placed in tendonitis brace. mobic given for next 2 weeks. Rehab given to start. Ice 20 minutes three times a days. Follow up with Dr. Karie Schwalbe in 2 weeks for potential injection if not improving.   Depression- PHQ-9 was 0. Refilled zoloft for 6 months. Need to follow up with PCP.

## 2014-01-14 NOTE — Patient Instructions (Signed)
Lateral Epicondylitis (Tennis Elbow) with Rehab Lateral epicondylitis involves inflammation and pain around the outer portion of the elbow. The pain is caused by inflammation of the tendons in the forearm that bring back (extend) the wrist. Lateral epicondylitis is also called tennis elbow, because it is very common in tennis players. However, it may occur in any individual who extends the wrist repetitively. If lateral epicondylitis is left untreated, it may become a chronic problem. SYMPTOMS   Pain, tenderness, and inflammation on the outer (lateral) side of the elbow.  Pain or weakness with gripping activities.  Pain that increases with wrist-twisting motions (playing tennis, using a screwdriver, opening a door or a jar).  Pain with lifting objects, including a coffee cup. CAUSES  Lateral epicondylitis is caused by inflammation of the tendons that extend the wrist. Causes of injury may include:  Repetitive stress and strain on the muscles and tendons that extend the wrist.  Sudden change in activity level or intensity.  Incorrect grip in racquet sports.  Incorrect grip size of racquet (often too large).  Incorrect hitting position or technique (usually backhand, leading with the elbow).  Using a racket that is too heavy. RISK INCREASES WITH:  Sports or occupations that require repetitive and/or strenuous forearm and wrist movements (tennis, squash, racquetball, carpentry).  Poor wrist and forearm strength and flexibility.  Failure to warm up properly before activity.  Resuming activity before healing, rehabilitation, and conditioning are complete. PREVENTION   Warm up and stretch properly before activity.  Maintain physical fitness:  Strength, flexibility, and endurance.  Cardiovascular fitness.  Wear and use properly fitted equipment.  Learn and use proper technique and have a coach correct improper technique.  Wear a tennis elbow (counterforce) brace. PROGNOSIS    The course of this condition depends on the degree of the injury. If treated properly, acute cases (symptoms lasting less than 4 weeks) are often resolved in 2 to 6 weeks. Chronic (longer lasting cases) often resolve in 3 to 6 months but may require physical therapy. RELATED COMPLICATIONS   Frequently recurring symptoms, resulting in a chronic problem. Properly treating the problem the first time decreases frequency of recurrence.  Chronic inflammation, scarring tendon degeneration, and partial tendon tear, requiring surgery.  Delayed healing or resolution of symptoms. TREATMENT  Treatment first involves the use of ice and medicine to reduce pain and inflammation. Strengthening and stretching exercises may help reduce discomfort if performed regularly. These exercises may be performed at home if the condition is an acute injury. Chronic cases may require a referral to a physical therapist for evaluation and treatment. Your caregiver may advise a corticosteroid injection to help reduce inflammation. Rarely, surgery is needed. MEDICATION  If pain medicine is needed, nonsteroidal anti-inflammatory medicines (aspirin and ibuprofen), or other minor pain relievers (acetaminophen), are often advised.  Do not take pain medicine for 7 days before surgery.  Prescription pain relievers may be given, if your caregiver thinks they are needed. Use only as directed and only as much as you need.  Corticosteroid injections may be recommended. These injections should be reserved only for the most severe cases, because they can only be given a certain number of times. HEAT AND COLD  Cold treatment (icing) should be applied for 10 to 15 minutes every 2 to 3 hours for inflammation and pain, and immediately after activity that aggravates your symptoms. Use ice packs or an ice massage.  Heat treatment may be used before performing stretching and strengthening activities prescribed by your   caregiver, physical  therapist, or athletic trainer. Use a heat pack or a warm water soak. SEEK MEDICAL CARE IF: Symptoms get worse or do not improve in 2 weeks, despite treatment. EXERCISES  RANGE OF MOTION (ROM) AND STRETCHING EXERCISES - Epicondylitis, Lateral (Tennis Elbow) These exercises may help you when beginning to rehabilitate your injury. Your symptoms may go away with or without further involvement from your physician, physical therapist, or athletic trainer. While completing these exercises, remember:   Restoring tissue flexibility helps normal motion to return to the joints. This allows healthier, less painful movement and activity.  An effective stretch should be held for at least 30 seconds.  A stretch should never be painful. You should only feel a gentle lengthening or release in the stretched tissue. RANGE OF MOTION - Wrist Flexion, Active-Assisted  Extend your right / left elbow with your fingers pointing down.*  Gently pull the back of your hand towards you, until you feel a gentle stretch on the top of your forearm.  Hold this position for __________ seconds. Repeat __________ times. Complete this exercise __________ times per day.  *If directed by your physician, physical therapist or athletic trainer, complete this stretch with your elbow bent, rather than extended. RANGE OF MOTION - Wrist Extension, Active-Assisted  Extend your right / left elbow and turn your palm upwards.*  Gently pull your palm and fingertips back, so your wrist extends and your fingers point more toward the ground.  You should feel a gentle stretch on the inside of your forearm.  Hold this position for __________ seconds. Repeat __________ times. Complete this exercise __________ times per day. *If directed by your physician, physical therapist or athletic trainer, complete this stretch with your elbow bent, rather than extended. STRETCH - Wrist Flexion  Place the back of your right / left hand on a  tabletop, leaving your elbow slightly bent. Your fingers should point away from your body.  Gently press the back of your hand down onto the table by straightening your elbow. You should feel a stretch on the top of your forearm.  Hold this position for __________ seconds. Repeat __________ times. Complete this stretch __________ times per day.  STRETCH - Wrist Extension   Place your right / left fingertips on a tabletop, leaving your elbow slightly bent. Your fingers should point backwards.  Gently press your fingers and palm down onto the table by straightening your elbow. You should feel a stretch on the inside of your forearm.  Hold this position for __________ seconds. Repeat __________ times. Complete this stretch __________ times per day.  STRENGTHENING EXERCISES - Epicondylitis, Lateral (Tennis Elbow) These exercises may help you when beginning to rehabilitate your injury. They may resolve your symptoms with or without further involvement from your physician, physical therapist, or athletic trainer. While completing these exercises, remember:   Muscles can gain both the endurance and the strength needed for everyday activities through controlled exercises.  Complete these exercises as instructed by your physician, physical therapist or athletic trainer. Increase the resistance and repetitions only as guided.  You may experience muscle soreness or fatigue, but the pain or discomfort you are trying to eliminate should never worsen during these exercises. If this pain does get worse, stop and make sure you are following the directions exactly. If the pain is still present after adjustments, discontinue the exercise until you can discuss the trouble with your caregiver. STRENGTH - Wrist Flexors  Sit with your right / left forearm palm-up   and fully supported on a table or countertop. Your elbow should be resting below the height of your shoulder. Allow your wrist to extend over the edge of  the surface.  Loosely holding a __________ weight, or a piece of rubber exercise band or tubing, slowly curl your hand up toward your forearm.  Hold this position for __________ seconds. Slowly lower the wrist back to the starting position in a controlled manner. Repeat __________ times. Complete this exercise __________ times per day.  STRENGTH - Wrist Extensors  Sit with your right / left forearm palm-down and fully supported on a table or countertop. Your elbow should be resting below the height of your shoulder. Allow your wrist to extend over the edge of the surface.  Loosely holding a __________ weight, or a piece of rubber exercise band or tubing, slowly curl your hand up toward your forearm.  Hold this position for __________ seconds. Slowly lower the wrist back to the starting position in a controlled manner. Repeat __________ times. Complete this exercise __________ times per day.  STRENGTH - Ulnar Deviators  Stand with a ____________________ weight in your right / left hand, or sit while holding a rubber exercise band or tubing, with your healthy arm supported on a table or countertop.  Move your wrist, so that your pinkie travels toward your forearm and your thumb moves away from your forearm.  Hold this position for __________ seconds and then slowly lower the wrist back to the starting position. Repeat __________ times. Complete this exercise __________ times per day STRENGTH - Radial Deviators  Stand with a ____________________ weight in your right / left hand, or sit while holding a rubber exercise band or tubing, with your injured arm supported on a table or countertop.  Raise your hand upward in front of you or pull up on the rubber tubing.  Hold this position for __________ seconds and then slowly lower the wrist back to the starting position. Repeat __________ times. Complete this exercise __________ times per day. STRENGTH - Forearm Supinators   Sit with your  right / left forearm supported on a table, keeping your elbow below shoulder height. Rest your hand over the edge, palm down.  Gently grip a hammer or a soup ladle.  Without moving your elbow, slowly turn your palm and hand upward to a "thumbs-up" position.  Hold this position for __________ seconds. Slowly return to the starting position. Repeat __________ times. Complete this exercise __________ times per day.  STRENGTH - Forearm Pronators   Sit with your right / left forearm supported on a table, keeping your elbow below shoulder height. Rest your hand over the edge, palm up.  Gently grip a hammer or a soup ladle.  Without moving your elbow, slowly turn your palm and hand upward to a "thumbs-up" position.  Hold this position for __________ seconds. Slowly return to the starting position. Repeat __________ times. Complete this exercise __________ times per day.  STRENGTH - Grip  Grasp a tennis ball, a dense sponge, or a large, rolled sock in your hand.  Squeeze as hard as you can, without increasing any pain.  Hold this position for __________ seconds. Release your grip slowly. Repeat __________ times. Complete this exercise __________ times per day.  STRENGTH - Elbow Extensors, Isometric  Stand or sit upright, on a firm surface. Place your right / left arm so that your palm faces your stomach, and it is at the height of your waist.  Place your opposite hand   on the underside of your forearm. Gently push up as your right / left arm resists. Push as hard as you can with both arms, without causing any pain or movement at your right / left elbow. Hold this stationary position for __________ seconds. Gradually release the tension in both arms. Allow your muscles to relax completely before repeating. Document Released: 12/28/2004 Document Revised: 05/14/2013 Document Reviewed: 04/11/2008 [facility] Patient Information 2015 [place], [place]. This information is not intended to replace advice  given to you by your health care provider. Make sure you discuss any questions you have with your health care provider. Bacterial Conjunctivitis Bacterial conjunctivitis, commonly called pink eye, is an inflammation of the clear membrane that covers the white part of the eye (conjunctiva). The inflammation can also happen on the underside of the eyelids. The blood vessels in the conjunctiva become inflamed, causing the eye to become red or pink. Bacterial conjunctivitis may spread easily from one eye to another and from person to person (contagious).  CAUSES  Bacterial conjunctivitis is caused by bacteria. The bacteria may come from your own skin, your upper respiratory tract, or from someone else with bacterial conjunctivitis. SYMPTOMS  The normally white color of the eye or the underside of the eyelid is usually pink or red. The pink eye is usually associated with irritation, tearing, and some sensitivity to light. Bacterial conjunctivitis is often associated with a thick, yellowish discharge from the eye. The discharge may turn into a crust on the eyelids overnight, which causes your eyelids to stick together. If a discharge is present, there may also be some blurred vision in the affected eye. DIAGNOSIS  Bacterial conjunctivitis is diagnosed by your caregiver through an eye exam and the symptoms that you report. Your caregiver looks for changes in the surface tissues of your eyes, which may point to the specific type of conjunctivitis. A sample of any discharge may be collected on a cotton-tip swab if you have a severe case of conjunctivitis, if your cornea is affected, or if you keep getting repeat infections that do not respond to treatment. The sample will be sent to a lab to see if the inflammation is caused by a bacterial infection and to see if the infection will respond to antibiotic medicines. TREATMENT   Bacterial conjunctivitis is treated with antibiotics. Antibiotic eyedrops are most often  used. However, antibiotic ointments are also available. Antibiotics pills are sometimes used. Artificial tears or eye washes may ease discomfort. HOME CARE INSTRUCTIONS   To ease discomfort, apply a cool, clean washcloth to your eye for 10-20 minutes, 3-4 times a day.  Gently wipe away any drainage from your eye with a warm, wet washcloth or a cotton ball.  Wash your hands often with soap and water. Use paper towels to dry your hands.  Do not share towels or washcloths. This may spread the infection.  Change or wash your pillowcase every day.  You should not use eye makeup until the infection is gone.  Do not operate machinery or drive if your vision is blurred.  Stop using contact lenses. Ask your caregiver how to sterilize or replace your contacts before using them again. This depends on the type of contact lenses that you use.  When applying medicine to the infected eye, do not touch the edge of your eyelid with the eyedrop bottle or ointment tube. SEEK IMMEDIATE MEDICAL CARE IF:   Your infection has not improved within 3 days after beginning treatment.  You had yellow discharge  from your eye and it returns.  You have increased eye pain.  Your eye redness is spreading.  Your vision becomes blurred.  You have a fever or persistent symptoms for more than 2-3 days.  You have a fever and your symptoms suddenly get worse.  You have facial pain, redness, or swelling. MAKE SURE YOU:   Understand these instructions.  Will watch your condition.  Will get help right away if you are not doing well or get worse. Document Released: 12/28/2004 Document Revised: 05/14/2013 Document Reviewed: 05/31/2011 [facility] Patient Information 2015 [place], [place]. This information is not intended to replace advice given to you by your health care provider. Make sure you discuss any questions you have with your health care provider.

## 2014-04-03 ENCOUNTER — Other Ambulatory Visit: Payer: Self-pay | Admitting: Family Medicine

## 2014-07-24 ENCOUNTER — Other Ambulatory Visit: Payer: Self-pay | Admitting: Physician Assistant

## 2014-07-25 ENCOUNTER — Other Ambulatory Visit: Payer: Self-pay | Admitting: Physician Assistant

## 2014-08-28 ENCOUNTER — Other Ambulatory Visit: Payer: Self-pay | Admitting: Family Medicine

## 2014-09-02 ENCOUNTER — Ambulatory Visit (INDEPENDENT_AMBULATORY_CARE_PROVIDER_SITE_OTHER): Payer: BLUE CROSS/BLUE SHIELD | Admitting: Family Medicine

## 2014-09-02 ENCOUNTER — Encounter: Payer: Self-pay | Admitting: Family Medicine

## 2014-09-02 VITALS — BP 125/74 | HR 72 | Ht 62.0 in | Wt 204.0 lb

## 2014-09-02 DIAGNOSIS — Z114 Encounter for screening for human immunodeficiency virus [HIV]: Secondary | ICD-10-CM | POA: Diagnosis not present

## 2014-09-02 DIAGNOSIS — D509 Iron deficiency anemia, unspecified: Secondary | ICD-10-CM

## 2014-09-02 DIAGNOSIS — E611 Iron deficiency: Secondary | ICD-10-CM

## 2014-09-02 DIAGNOSIS — E559 Vitamin D deficiency, unspecified: Secondary | ICD-10-CM | POA: Diagnosis not present

## 2014-09-02 DIAGNOSIS — F329 Major depressive disorder, single episode, unspecified: Secondary | ICD-10-CM

## 2014-09-02 DIAGNOSIS — F32A Depression, unspecified: Secondary | ICD-10-CM

## 2014-09-02 DIAGNOSIS — G43009 Migraine without aura, not intractable, without status migrainosus: Secondary | ICD-10-CM | POA: Insufficient documentation

## 2014-09-02 MED ORDER — SERTRALINE HCL 50 MG PO TABS: ORAL_TABLET | ORAL | Status: DC

## 2014-09-02 MED ORDER — TOPIRAMATE 25 MG PO TABS: ORAL_TABLET | ORAL | Status: DC

## 2014-09-02 NOTE — Progress Notes (Signed)
   Subjective:    Patient ID: Jessica Cunningham, female    DOB: 1975/06/23, 39 y.o.   MRN: [id number]  HPI Here today for follow-up depression. She's currently on sertraline 75 mg daily and doing well on her current regimen. She denies any depressive type symptoms recently. She has had some issues with low energy and sleep and feeling bad about herself.she denies any S.E.   Migraines-she would like discussed starting Topamax to help control her migraines.  She has never taken it before.  Says has a headache almost every day.  Says about once a month gets an actual migraine and gets nauseated and has to go to bed.  Says feels like some of her irritability  bc she has a HA and isn't hurting  Aleve helps some. Doesn't use it daily.  Says usually will be a peircing pain in the back of her head on the left side. Says occ will throb.  Light and driving tend to trigger her HAs.    Review of Systems     Objective:   Physical Exam  Constitutional: She is oriented to person, place, and time. She appears well-developed and well-nourished.  HENT:  Head: Normocephalic and atraumatic.  Cardiovascular: Normal rate, regular rhythm and normal heart sounds.   Pulmonary/Chest: Effort normal and breath sounds normal.  Neurological: She is alert and oriented to person, place, and time.  Skin: Skin is warm and dry.  Psychiatric: She has a normal mood and affect. Her behavior is normal.          Assessment & Plan:  Depression-PHQ 9 score was 4 today but first 2 symptoms were negative. She rates her symptoms as not difficult at all. Well controlled. Continue current regimen. Follow-up in 6 months.  Migraine headaches, no aura and chronic daily Headache. Uncontrolled.  Discussed option. Start topamax.  Discussed potential side effects. Call if any palms or concerns. We cannot change of the phone if needed. Perception written for a taper. She'll be on 50 mg twice a day when I see her back.  Low iron-due to  recheck iron stores.  Recommend HIV screening.

## 2014-09-06 LAB — CBC
HEMATOCRIT: 41.8 % (ref 36.0–46.0)
HEMOGLOBIN: 13.2 g/dL (ref 12.0–15.0)
MCH: 27.3 pg (ref 26.0–34.0)
MCHC: 31.6 g/dL (ref 30.0–36.0)
MCV: 86.5 fL (ref 78.0–100.0)
MPV: 9.7 fL (ref 8.6–12.4)
Platelets: 335 10*3/uL (ref 150–400)
RBC: 4.83 MIL/uL (ref 3.87–5.11)
RDW: 14.4 % (ref 11.5–15.5)
WBC: 6.8 10*3/uL (ref 4.0–10.5)

## 2014-09-06 LAB — COMPLETE METABOLIC PANEL WITH GFR
ALT: 18 U/L (ref 6–29)
AST: 13 U/L (ref 10–30)
Albumin: 3.9 g/dL (ref 3.6–5.1)
Alkaline Phosphatase: 71 U/L (ref 33–115)
BILIRUBIN TOTAL: 0.3 mg/dL (ref 0.2–1.2)
BUN: 19 mg/dL (ref 7–25)
CHLORIDE: 108 mmol/L (ref 98–110)
CO2: 23 mmol/L (ref 20–31)
CREATININE: 0.62 mg/dL (ref 0.50–1.10)
Calcium: 9 mg/dL (ref 8.6–10.2)
GFR, Est Non African American: >89 mL/min (ref >=60)
Glucose, Bld: 99 mg/dL (ref 65–99)
Potassium: 4.2 mmol/L (ref 3.5–5.3)
Sodium: 142 mmol/L (ref 135–146)
TOTAL PROTEIN: 6.9 g/dL (ref 6.1–8.1)

## 2014-09-06 LAB — IRON: Iron: 46 ug/dL (ref 42–145)

## 2014-09-06 LAB — FERRITIN: Ferritin: 19 ng/mL (ref 10–291)

## 2014-09-06 LAB — LIPID PANEL
CHOLESTEROL: 160 mg/dL (ref 125–200)
HDL: 21 mg/dL — ABNORMAL LOW (ref >=46)
LDL CALC: 114 mg/dL (ref <130)
TRIGLYCERIDES: 124 mg/dL (ref <150)
Total CHOL/HDL Ratio: 7.6 Ratio — ABNORMAL HIGH (ref <=5.0)
VLDL: 25 mg/dL (ref <30)

## 2014-09-06 LAB — VITAMIN D 25 HYDROXY (VIT D DEFICIENCY, FRACTURES): Vit D, 25-Hydroxy: 21 ng/mL — ABNORMAL LOW (ref 30–100)

## 2014-09-06 LAB — TSH: TSH: 1.662 u[IU]/mL (ref 0.350–4.500)

## 2014-09-06 LAB — HIV ANTIBODY (ROUTINE TESTING W REFLEX): HIV 1&2 Ab, 4th Generation: NONREACTIVE

## 2014-09-22 ENCOUNTER — Other Ambulatory Visit: Payer: Self-pay | Admitting: Family Medicine

## 2014-10-01 ENCOUNTER — Ambulatory Visit (INDEPENDENT_AMBULATORY_CARE_PROVIDER_SITE_OTHER): Payer: BLUE CROSS/BLUE SHIELD | Admitting: Physician Assistant

## 2014-10-01 ENCOUNTER — Encounter: Payer: Self-pay | Admitting: Physician Assistant

## 2014-10-01 VITALS — BP 121/66 | HR 66 | Temp 98.6°F | Ht 62.0 in | Wt 202.0 lb

## 2014-10-01 DIAGNOSIS — H9202 Otalgia, left ear: Secondary | ICD-10-CM | POA: Diagnosis not present

## 2014-10-01 DIAGNOSIS — H65192 Other acute nonsuppurative otitis media, left ear: Secondary | ICD-10-CM

## 2014-10-01 MED ORDER — AMOXICILLIN 875 MG PO TABS: 875.0000 mg | ORAL_TABLET | Freq: Two times a day (BID) | ORAL | Status: DC

## 2014-10-01 NOTE — Progress Notes (Signed)
   Subjective:    Patient ID: Jessica Cunningham, female    DOB: April 13, 1975, 39 y.o.   MRN: [id number]  HPI Patient presents to clinic with left ear pain. She went swimming on Saturday and symptoms began Monday morning. She reports pain in ear, mastoid, and mandible. She denies any dizziness or changes in hearing or balance. Pain has increased in severity since onset. Pain is exacerbated by eating and talking, there are no palliative factors. She denies any symptoms in the right ear. She has not tried anything to make better. No fever, chills, sinus pressure, cough.   .. Past Medical History  Diagnosis Date  . Depression   . Allergy   . Hyperlipidemia     Review of Systems Positive for symptoms listed in HPI    Objective:   Physical Exam  Vitals: Blood pressure 121/66, pulse 66, temperature 98.6 F (37 C), height , weight 202 lb.  Constitutional: Well developed, well nourished, and not under acute distress Ears: Left TM bulging and erythematous with yellow discoloration, no exudate or perforation observed. Right TM clear. Mastoid process tender to palpation Neck: Swelling and tenderness palpated along mandible  Cardiac: Normal S1, S2. No M/G/R Pulm: CTAB      Assessment & Plan:  Acute Left Otitis Media- Rx 875 mg amoxicillin tablet bid x 10 days. Patient instructed to return if symptoms do not improve or worsen within 2 days. Encouraged intranasal fluticasone to assist with eustachian tube dysfunction.   Reviewed and changes made by Tandy Gaw PA-C

## 2014-10-03 ENCOUNTER — Encounter: Payer: Self-pay | Admitting: Physician Assistant

## 2014-10-04 ENCOUNTER — Encounter: Payer: Self-pay | Admitting: Physician Assistant

## 2014-10-04 ENCOUNTER — Ambulatory Visit (INDEPENDENT_AMBULATORY_CARE_PROVIDER_SITE_OTHER): Payer: BLUE CROSS/BLUE SHIELD | Admitting: Physician Assistant

## 2014-10-04 ENCOUNTER — Ambulatory Visit (INDEPENDENT_AMBULATORY_CARE_PROVIDER_SITE_OTHER): Payer: BLUE CROSS/BLUE SHIELD

## 2014-10-04 VITALS — BP 127/68 | HR 64 | Temp 98.1°F | Ht 62.0 in | Wt 203.0 lb

## 2014-10-04 DIAGNOSIS — R208 Other disturbances of skin sensation: Secondary | ICD-10-CM

## 2014-10-04 DIAGNOSIS — M542 Cervicalgia: Secondary | ICD-10-CM

## 2014-10-04 DIAGNOSIS — G43001 Migraine without aura, not intractable, with status migrainosus: Secondary | ICD-10-CM

## 2014-10-04 DIAGNOSIS — R0989 Other specified symptoms and signs involving the circulatory and respiratory systems: Secondary | ICD-10-CM

## 2014-10-04 DIAGNOSIS — R591 Generalized enlarged lymph nodes: Secondary | ICD-10-CM

## 2014-10-04 DIAGNOSIS — M5412 Radiculopathy, cervical region: Secondary | ICD-10-CM

## 2014-10-04 DIAGNOSIS — R200 Anesthesia of skin: Secondary | ICD-10-CM

## 2014-10-04 MED ORDER — PREDNISONE 20 MG PO TABS: ORAL_TABLET | ORAL | Status: DC

## 2014-10-04 NOTE — Progress Notes (Signed)
   Subjective:    Patient ID: Jessica Cunningham, female    DOB: [birth year], 39 y.o.   MRN: [id number]  HPI patient is a 39 year old female who presents to the clinic to follow-up after being started on an biotic 2 days ago. Her lymph nodes continues to feel tender and swollen. She denies any cough, shortness of breath, ear pain. She also mentions that her migraines have not improved with Topamax and she has a lot of problems communicating thoughts. Patient feels "dumb" on Topamax. Per patient she has had migraines and headaches since second grade. She has seen her sister go through Dr. after Dr. and headache specialist. Her sister still has no relief. She is not interested in this workup. The only thing that did help her headaches was a chiropractic adjustment. However she did this a few times and it was very expensive and her rebound migraine after was worse. She does report bilateral numbness and tingling in her hands. She's never had a workup.    Review of Systems  All other systems reviewed and are negative.      Objective:   Physical Exam  Constitutional: She appears well-developed and well-nourished.  HENT:  Head: Normocephalic and atraumatic.  Right Ear: External ear normal.  Left Ear: External ear normal.  Nose: Nose normal.  Mouth/Throat: Oropharynx is clear and moist. No oropharyngeal exudate.  Eyes: Conjunctivae are normal. Right eye exhibits no discharge. Left eye exhibits no discharge.  Neck:  Tender left side lymphnodes slightly enlarged.   Cardiovascular: Normal rate, regular rhythm and normal heart sounds.   Pulmonary/Chest: Effort normal and breath sounds normal.  Musculoskeletal: Normal range of motion.  Tenderness to palpation at base of occipital area bilaterally.  No pain over c-spine.  Normal ROM of neck.  Some paraspinous tightness of cervical area.   Psychiatric: She has a normal mood and affect. Her behavior is normal.          Assessment & Plan:  Tender  lymph nodes/Left OM- patient is are any on antibiotic. I do think enlarged due to LOM. Will start a prednisone taper today to help with the reactiveness of the lymph nodes. Continue to follow up if not resolving or becoming more tender or increase please follow-up.  Migraines/cervical radiculopathy- I certainly do think there could be a component of cervical radiculopathy that could be contributing to migraines. Patient was unable to tolerate Topamax. She is tapering off now. I will get a C-spine x-ray today. I discussed starting baclofen to help relax some of her muscles. She declined today. Hopefully prednisone taper will help some with the symptoms. I would like for her to see one of our sports medicine physicians here in office. I do think patient would benefit from some physical therapy.

## 2014-10-07 ENCOUNTER — Encounter: Payer: Self-pay | Admitting: Physician Assistant

## 2014-10-14 ENCOUNTER — Encounter: Payer: Self-pay | Admitting: Family Medicine

## 2014-10-14 ENCOUNTER — Ambulatory Visit (INDEPENDENT_AMBULATORY_CARE_PROVIDER_SITE_OTHER): Payer: BLUE CROSS/BLUE SHIELD | Admitting: Family Medicine

## 2014-10-14 VITALS — BP 141/70 | HR 68 | Wt 203.0 lb

## 2014-10-14 DIAGNOSIS — R208 Other disturbances of skin sensation: Secondary | ICD-10-CM | POA: Diagnosis not present

## 2014-10-14 DIAGNOSIS — R200 Anesthesia of skin: Secondary | ICD-10-CM

## 2014-10-14 DIAGNOSIS — M546 Pain in thoracic spine: Secondary | ICD-10-CM

## 2014-10-14 DIAGNOSIS — M549 Dorsalgia, unspecified: Secondary | ICD-10-CM | POA: Insufficient documentation

## 2014-10-14 NOTE — Assessment & Plan Note (Signed)
Likely related to carpal tunnel syndrome. Peripheral neuropathy is not ruled out. Patient would like to wait on further workup at this time. She will proceed with carpal tunnel workup if she develops weakness or persistent numbness.

## 2014-10-14 NOTE — Assessment & Plan Note (Signed)
Neck and back pain is likely related to muscle spasms. Refer to physical therapy. Recheck in one month.

## 2014-10-14 NOTE — Patient Instructions (Signed)
Thank you for coming in today. Return in 4 weeks.  Attend physical therapy.  Continue the brace.  Let me know if you develop weakness.

## 2014-10-14 NOTE — Progress Notes (Signed)
   Subjective:    I'm seeing this patient as a consultation for:  Tandy Gaw PA-C  CC: Back pain and hand numbness  HPI:  1) back pain: Patient has ongoing cervical thoracic and lumbar pain. This is been present for years without injury. This occurs intermittently. She notes the pain is sometimes bothersome sometimes not. She's doing pretty well recently. She was seen by Ms Caleen Essex recently who obtained a cervical spine x-ray which was normal.  She notes she has intermittent spasm especially at the base of her skull that seems to contributing to her headaches. She has never had much evaluation for this. She takes NSAIDs intermittently.  2) hand numbness: Patient has intermittent bilateral hand numbness. This seems to worsen after a lot of activity. She denies any tingling sensation or significant weakness. The numbness is predominantly and her first 4 digits but sometimes occurs in her fifth digit as well.  She will sometimes wake up with numbness in the morning. In the past remotely she was prescribed some wrist braces which she uses at night which seems to help. She states the symptoms do not bother her very much at this time.  Past medical history, Surgical history, Family history not pertinant except as noted below, Social history, Allergies, and medications have been entered into the medical record, reviewed, and no changes needed.   Review of Systems: No headache, visual changes, nausea, vomiting, diarrhea, constipation, dizziness, abdominal pain, skin rash, fevers, chills, night sweats, weight loss, swollen lymph nodes, body aches, joint swelling, muscle aches, chest pain, shortness of breath, mood changes, visual or auditory hallucinations.   Objective:    Filed Vitals:   10/14/14 1005  BP: 141/70  Pulse: 68   General: Well Developed, well nourished, and in no acute distress.  Neuro/Psych: Alert and oriented x3, extra-ocular muscles intact, able to move all 4 extremities,  sensation grossly intact. Skin: Warm and dry, no rashes noted.  Respiratory: Not using accessory muscles, speaking in full sentences, trachea midline.  Cardiovascular: Pulses palpable, no extremity edema. Abdomen: Does not appear distended. MSK: Neck: Nontender to midline normal neck range of motion. Negative Spurling's test Thoracic spine nontender normal motion Lumbar spine nontender normal motion Upper extremity strength is equal and normal throughout with normal sensation capillary refill and pulses. Negative Tinel's test bilateral cubital tunnels. Negative Tinel's test bilateral carpal tunnel. Positive Phalen's test bilaterally immediately. Grip strength is equal and normal bilaterally. Sensation is intact throughout. Hands are normal appearing bilaterally without any atrophy.  No results found for this or any previous visit (from the past 24 hour(s)). No results found.  Impression and Recommendations:   This case required medical decision making of moderate complexity.

## 2014-10-16 ENCOUNTER — Encounter: Payer: Self-pay | Admitting: Rehabilitative and Restorative Service Providers"

## 2014-10-16 ENCOUNTER — Ambulatory Visit (INDEPENDENT_AMBULATORY_CARE_PROVIDER_SITE_OTHER): Payer: BLUE CROSS/BLUE SHIELD | Admitting: Rehabilitative and Restorative Service Providers"

## 2014-10-16 DIAGNOSIS — M2560 Stiffness of unspecified joint, not elsewhere classified: Secondary | ICD-10-CM

## 2014-10-16 DIAGNOSIS — G44229 Chronic tension-type headache, not intractable: Secondary | ICD-10-CM

## 2014-10-16 DIAGNOSIS — M623 Immobility syndrome (paraplegic): Secondary | ICD-10-CM | POA: Diagnosis not present

## 2014-10-16 DIAGNOSIS — R293 Abnormal posture: Secondary | ICD-10-CM

## 2014-10-16 DIAGNOSIS — M549 Dorsalgia, unspecified: Secondary | ICD-10-CM

## 2014-10-16 DIAGNOSIS — Z7409 Other reduced mobility: Secondary | ICD-10-CM

## 2014-10-16 NOTE — Patient Instructions (Signed)
Axial Extension (Chin Tuck) Pull chin in and lengthen back of neck. Hold ___10-20_ seconds while counting out loud. Repeat _5-10___ times. Do __several__ sessions per day.  Abdominal Bracing With Pelvic Floor (Hook-Lying) With neutral spine, tighten pelvic floor and abdominals, sucking belly button to back bone; tighten muscles in back at waist. Hold 10-20 sec Repeat __10_ times. Do _several__ times a day. Progress to do this exercise in sitting; standing; walking; functional activities   Scapular Retraction (Standing) With arms at sides, pinch shoulder blades down and back. Hold 10-20 sec Repeat __10__ times per set. Do _several sessions per day.    Scapula Adduction With Pectoralis Stretch: Low - Standing   Shoulders at 45 hands even with shoulders, keeping weight through legs, shift weight forward until you feel pull or stretch through the front of your chest. Hold _30__ seconds. Do _3__ times, _2-4__ times per day.   Scapula Adduction With Pectoralis Stretch: Mid-Range - Standing   Shoulders at 90 elbows even with shoulders, keeping weight through legs, shift weight forward until you feel pull or strength through the front of your chest. Hold __30_ seconds. Do _3__ times, __2-4_ times per day.   Scapula Adduction With Pectoralis Stretch: High - Standing   Shoulders at 120 hands up high on the doorway, keeping weight on feet, shift weight forward until you feel pull or stretch through the front of your chest. Hold _30__ seconds. Do _3__ times, _2-3__ times per day.

## 2014-10-16 NOTE — Therapy (Signed)
[facility] [place] [address] [place], [place], [address] Phone: [phone]   Fax:  [phone]  Physical Therapy Evaluation  Patient Details  Name: Jessica Cunningham MRN: [id number] Date of Birth: [birth year] Referring Provider:  Rodolph Bong, MD  Encounter Date: 10/16/2014      PT End of Session - 10/16/14 0848    Visit Number 1   Number of Visits 12   Date for PT Re-Evaluation 11/27/14   PT Start Time 0845   PT Stop Time 0950   PT Time Calculation (min) 65 min   Activity Tolerance Patient tolerated treatment well      Past Medical History  Diagnosis Date  . Depression   . Allergy   . Hyperlipidemia     Past Surgical History  Procedure Laterality Date  . Tonsillectomy    . Ltcs      x2  . Cesarean section      There were no vitals filed for this visit.  Visit Diagnosis:  Spinal pain - Plan: PT plan of care cert/re-cert  Chronic tension-type headache, not intractable - Plan: PT plan of care cert/re-cert  Stiffness due to immobility - Plan: PT plan of care cert/re-cert  Abnormal posture - Plan: PT plan of care cert/re-cert  Impaired functional mobility and endurance - Plan: PT plan of care cert/re-cert      Subjective Assessment - 10/16/14 0849    Subjective Jessica Cunningham reports increase in LBP over the past few months beginning in April, 2016. She has a history of back pain and migraine headaches. She has had back pain and numbness in both UE's on an intermittent basis dependent on activity level. She has tried therapy and chiropractic care in the past.   Pertinent History LBP since 39 years old; mid back pain since 39 yr old; neck pain since 39 years old; headaches since 39 yr old; fx bilat wrists at 73 Lt/39 yr old; C-sections 2008 and 2009   How long can you sit comfortably? a few hours if she is wiggling in her chair   How long can you stand comfortably? 1 hour   How long can you walk comfortably? no limit    Diagnostic tests xray of neck (-)   Patient Stated Goals would like to find ways to help the pain in her neck and upper back   Currently in Pain? Yes   Pain Score 7    Pain Location Neck   Pain Orientation Right   Pain Descriptors / Indicators --  pinching    Pain Type Chronic pain   Pain Radiating Towards into head creating HA   Pain Onset More than a month ago   Pain Frequency Intermittent   Aggravating Factors  any activity   Pain Relieving Factors ice and rest offer temporary help; aleve            [facility] PT Assessment - 10/16/14 0001    Assessment   Medical Diagnosis bilat thoracic pain/ neck pain/ headaches   Onset Date/Surgical Date 04/27/14   Hand Dominance Right   Next MD Visit 11/16   Prior Therapy chiropractic care 4/16   Precautions   Precautions None   Balance Screen   Has the patient fallen in the past 6 months Yes   How many times? 1   Has the patient had a decrease in activity level because of a fear of falling?  No   Is the patient reluctant to leave their home because of  a fear of falling?  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   Leisure household chores; child care; soft couch to rest    Observation/Other Assessments   Focus on Therapeutic Outcomes (FOTO)  36% limitation   Sensation   Additional Comments intiermittent numbness and tingling into either or both arms in varying patterns lasting from a few minutes to a few days    Posture/Postural Control   Posture Comments sits with Rt LE tucked under LT in chair; head forward; shoulders rounded and elevated; increased thoracic kyphosis; knees hyperextended   AROM   Cervical Flexion 47   Cervical Extension 44   Cervical - Right Side Bend 38   Cervical - Left Side Bend 41    Cervical - Right Rotation 68   Cervical - Left Rotation 55   Lumbar Flexion 70%   Lumbar Extension 45%   Lumbar - Right Side Bend 60%   Lumbar - Left Side Bend 65%   Lumbar - Right Rotation 40%   Lumbar - Left Rotation 40%   Strength   Overall Strength Comments 5/5 U/LE's    Flexibility   Hamstrings Rt 60 deg; Lt 67   Quadriceps heel to buttock Rt 2 inches; Lt 1 inch   ITB tight bilat   Piriformis tight bilat   Palpation   Palpation comment generalized spinal tightness with spring testing; muscular tightness throughout greatest at Rt ant/lat/post cervical musculature and through Rt upper trap/pecs                   [facility] Adult PT Treatment/Exercise - 10/16/14 0001    Self-Care   Self-Care --  avoid sitting w/ one leg under body   Therapeutic Activites    Therapeutic Activities --  myofacial ball release work   Neuro Re-ed    Neuro Re-ed Details  postural correction engaging upper core   Neck Exercises: Supine   Neck Retraction 5 reps;10 secs   Other Supine Exercise 3 part core 10 sec hold 10 reps   Cryotherapy   Number Minutes Cryotherapy 15 Minutes   Cryotherapy Location Cervical  thoracic   Type of Cryotherapy Ice pack   Electrical Stimulation   Electrical Stimulation Location Rt cervical/trap area   Statistician Action IFC   Electrical Stimulation Parameters to tolerance   Electrical Stimulation Goals Pain  muscular tightness   Neck Exercises: Marine scientist Limitations 3 way doorway 10 sec as tol with UE parasthesias 2 reps each position                PT Education - 10/16/14 [phone]    Education provided Yes   Education Details Postural education; postural correction; myofacial ball release work; Child psychotherapist) Educated Patient   Methods Explanation;Demonstration;Tactile cues;Verbal cues;Handout   Comprehension Verbalized understanding;Returned demonstration;Verbal cues required;Tactile cues required              PT Long Term Goals - 10/16/14 0949    PT LONG TERM GOAL #1   Title Pt I in HEP 11/27/14   Time 6   Period Weeks   Status New   PT LONG TERM GOAL #2   Title Improve posture and alignment with good control of upper core postural musculature 11/27/14  Time 6   Period Weeks   Status New   PT LONG TERM GOAL #3   Title Increase cervical ROM by 5-15 degrees 11/27/14   Time 6   Period Weeks   Status New   PT LONG TERM GOAL #4   Title Decrease frequency, intensity and duration of pain and HA's by 50% to 4/10 or less 11/27/14   Time 6   Period Weeks   Status New   PT LONG TERM GOAL #5   Title Decrease FOTO to </=32% limitaion 11/27/14   Time 6   Period Weeks   Status New               Plan - 10/16/14 0945    Clinical Impression Statement Angelica Chessman presents with exercabation of long standing spine pain currently greatest in the thoracic and cervical spine creating HA's on an intermittent basis. She has poor posture and alignment; decreased spinal mobility; tenderness and tightness to palpation. poor postural stability and strength; pain and limited functional activity level. Pt will benefit from Physical Therapy to address problems identified and return patient to more functional level.    Pt will benefit from skilled therapeutic intervention in order to improve on the following deficits Postural dysfunction;Improper body mechanics;Increased fascial restricitons;Pain;Decreased range of motion;Decreased mobility;Decreased endurance;Decreased activity tolerance   Rehab Potential Good   PT Frequency 2x / week   PT Duration 6 weeks   PT Treatment/Interventions Patient/family education;ADLs/Self Care Home Management;Manual techniques;Dry needling;Therapeutic activities;Therapeutic exercise;Cryotherapy;Electrical Stimulation;Moist Heat;Ultrasound   PT Next Visit Plan review HEP; continue postural education and correction; progress with stretching before beginning posterior shoulder girdle  strengthening   PT Home Exercise Plan postural correction; myofacila ball release; HEP   Consulted and Agree with Plan of Care Patient         Problem List Patient Active Problem List   Diagnosis Date Noted  . Back pain 10/14/2014  . Tenderness of lymph node 10/04/2014  . Bilateral hand numbness 10/04/2014  . Cervical radiculopathy 10/04/2014  . Migraine without aura, not intractable 09/02/2014  . Right lateral epicondylitis 01/14/2014  . Acute depression 10/04/2008  . ALLERGIC RHINITIS CAUSE UNSPECIFIED 10/04/2008    Cameron Katayama Rober Minion PT, MPH 10/16/2014, 10:04 AM  [facility] [address] [place], [place], [address] Phone: [phone]   Fax:  [phone]

## 2014-10-22 ENCOUNTER — Encounter: Payer: Self-pay | Admitting: Rehabilitative and Restorative Service Providers"

## 2014-10-22 ENCOUNTER — Ambulatory Visit (INDEPENDENT_AMBULATORY_CARE_PROVIDER_SITE_OTHER): Payer: BLUE CROSS/BLUE SHIELD | Admitting: Rehabilitative and Restorative Service Providers"

## 2014-10-22 DIAGNOSIS — M2560 Stiffness of unspecified joint, not elsewhere classified: Secondary | ICD-10-CM

## 2014-10-22 DIAGNOSIS — M549 Dorsalgia, unspecified: Secondary | ICD-10-CM

## 2014-10-22 DIAGNOSIS — G44229 Chronic tension-type headache, not intractable: Secondary | ICD-10-CM | POA: Diagnosis not present

## 2014-10-22 DIAGNOSIS — M623 Immobility syndrome (paraplegic): Secondary | ICD-10-CM

## 2014-10-22 DIAGNOSIS — R293 Abnormal posture: Secondary | ICD-10-CM | POA: Diagnosis not present

## 2014-10-22 DIAGNOSIS — Z7409 Other reduced mobility: Secondary | ICD-10-CM

## 2014-10-22 NOTE — Therapy (Signed)
[facility] [place] [address] [place], [place], [address] Phone: [phone]   Fax:  [phone]  Physical Therapy Treatment  Patient Details  Name: Jessica Cunningham MRN: [id number] Date of Birth: Jan 24, 1975 Referring Provider:  Rodolph Bong, MD  Encounter Date: 10/22/2014      PT End of Session - 10/22/14 0843    Visit Number 2   Number of Visits 12   Date for PT Re-Evaluation 11/27/14   PT Start Time 0804   PT Stop Time 0853   PT Time Calculation (min) 49 min   Activity Tolerance Patient tolerated treatment well      Past Medical History  Diagnosis Date  . Depression   . Allergy   . Hyperlipidemia     Past Surgical History  Procedure Laterality Date  . Tonsillectomy    . Ltcs      x2  . Cesarean section      There were no vitals filed for this visit.  Visit Diagnosis:  Spinal pain  Chronic tension-type headache, not intractable  Stiffness due to immobility  Abnormal posture  Impaired functional mobility and endurance      Subjective Assessment - 10/22/14 0805    Subjective Doing better. Working on posture and alignment. Not sitting on foot but doesn't like it. Less pain, fewer headaches. Pleased wth progress.    Currently in Pain? Yes   Pain Score 2    Pain Location Neck   Pain Orientation Right   Pain Descriptors / Indicators Tightness   Pain Type Chronic pain   Pain Onset More than a month ago   Pain Frequency Intermittent   Aggravating Factors  activity   Pain Relieving Factors ice; rest; aleve            [facility] PT Assessment - 10/22/14 0001    AROM   Cervical Flexion 43   Cervical Extension 50   Cervical - Right Side Bend 48   Cervical - Left Side Bend 46   Cervical - Right Rotation 72   Cervical - Left Rotation 71   Flexibility   Hamstrings Rt 73 deg; Lt 72 deg                      [facility] Adult PT Treatment/Exercise - 10/22/14 0001    Neuro Re-ed    Neuro  Re-ed Details  postural correction engaging upper core   Neck Exercises: Supine   Neck Retraction 5 reps;10 secs   Other Supine Exercise 3 part core 10 sec hold 10 reps   Other Supine Exercise x 3 reps with strap   Shoulder Exercises: Standing   Extension Strengthening;Both;20 reps;Theraband   Theraband Level (Shoulder Extension) Level 2 (Red)   Row Strengthening;Both;20 reps;Theraband   Theraband Level (Shoulder Row) Level 2 (Red)   Retraction Strengthening;Both;20 reps;Theraband   Theraband Level (Shoulder Retraction) Level 1 (Yellow)   Cryotherapy   Number Minutes Cryotherapy 15 Minutes   Cryotherapy Location Cervical  thoracic   Type of Cryotherapy Ice pack   Electrical Stimulation   Electrical Stimulation Location Rt cervical/trap area   Electrical Stimulation Action IFC   Electrical Stimulation Parameters to tolerance   Electrical Stimulation Goals Pain  muscle tightness   Manual Therapy   Soft tissue mobilization cervical   Myofascial Release anterior chest   Passive ROM cervical stretching   Manual Traction cervical   Neck Exercises: Marine scientist Limitations 3 way doorway 10 sec as  tol with UE parasthesias 2 reps each position                PT Education - 10/22/14 0823    Education provided Yes   Education Details posture; alignment; HEP   Person(s) Educated Patient   Methods Explanation;Demonstration;Tactile cues;Verbal cues;Handout   Comprehension Verbalized understanding;Returned demonstration;Verbal cues required;Tactile cues required             PT Long Term Goals - 10/16/14 0949    PT LONG TERM GOAL #1   Title Pt I in HEP 11/27/14   Time 6   Period Weeks   Status New   PT LONG TERM GOAL #2   Title Improve posture and alignment with good control of upper core postural musculature 11/27/14   Time 6   Period Weeks   Status New   PT LONG TERM GOAL #3   Title Increase cervical ROM by 5-15 degrees 11/27/14   Time 6   Period  Weeks   Status New   PT LONG TERM GOAL #4   Title Decrease frequency, intensity and duration of pain and HA's by 50% to 4/10 or less 11/27/14   Time 6   Period Weeks   Status New   PT LONG TERM GOAL #5   Title Decrease FOTO to </=32% limitaion 11/27/14   Time 6   Period Weeks   Status New               Plan - 10/22/14 0844    Clinical Impression Statement Progressing well with improved posture and alignment; increased cervical and LE mobility; decreased pain and HA's. Progressing well toward stated goals of therapy.   Pt will benefit from skilled therapeutic intervention in order to improve on the following deficits Postural dysfunction;Improper body mechanics;Increased fascial restricitons;Pain;Decreased range of motion;Decreased mobility;Decreased endurance;Decreased activity tolerance   Rehab Potential Good   PT Frequency 2x / week   PT Duration 6 weeks   PT Treatment/Interventions Patient/family education;ADLs/Self Care Home Management;Manual techniques;Dry needling;Therapeutic activities;Therapeutic exercise;Cryotherapy;Electrical Stimulation;Moist Heat;Ultrasound   PT Next Visit Plan review HEP; continue postural education and correction; progress with stretching; posterior shoulder girdle strengthening; add prolonged snow angel stretch   PT Home Exercise Plan postural correction; myofacila ball release; HEP   Consulted and Agree with Plan of Care Patient        Problem List Patient Active Problem List   Diagnosis Date Noted  . Back pain 10/14/2014  . Tenderness of lymph node 10/04/2014  . Bilateral hand numbness 10/04/2014  . Cervical radiculopathy 10/04/2014  . Migraine without aura, not intractable 09/02/2014  . Right lateral epicondylitis 01/14/2014  . Acute depression 10/04/2008  . ALLERGIC RHINITIS CAUSE UNSPECIFIED 10/04/2008    Lafern Brinkley Rober Minion PT MPH 10/22/2014, 8:47 AM  [facility] [address]  [address] [place], [place], [address] Phone: [phone]   Fax:  [phone]

## 2014-10-22 NOTE — Patient Instructions (Signed)
Resisted External Rotation: in Neutral - Bilateral   PALMS UP Sit or stand, tubing in both hands, elbows at sides, bent to 90, forearms forward. Pinch shoulder blades together and rotate forearms out. Keep elbows at sides. Repeat __10__ times per set. Do _2-3___ sets per session. Do _2-3___ sessions per day.  HIP: Hamstrings - Supine    Low Row: Standing   Face anchor, feet shoulder width apart. Palms up, pull arms back, squeezing shoulder blades together. Repeat 10__ times per set. Do 2-3__ sets per session. Do 2-3__ sessions per week. Anchor Height: Waist     Strengthening: Resisted Extension   Hold tubing in right hand, arm forward. Pull arm back, elbow straight. Repeat _10___ times per set. Do 2-3____ sets per session. Do 2-3____ sessions per day.   HIP: Hamstrings - Supine    Place strap around foot. Raise leg up, keep knee straight. Bend opposite knee. Hold _30__ seconds. _3__ reps per set, 2-3___ days per day

## 2014-10-24 ENCOUNTER — Ambulatory Visit (INDEPENDENT_AMBULATORY_CARE_PROVIDER_SITE_OTHER): Payer: BLUE CROSS/BLUE SHIELD | Admitting: Physical Therapy

## 2014-10-24 DIAGNOSIS — R293 Abnormal posture: Secondary | ICD-10-CM

## 2014-10-24 DIAGNOSIS — Z7409 Other reduced mobility: Secondary | ICD-10-CM

## 2014-10-24 DIAGNOSIS — M549 Dorsalgia, unspecified: Secondary | ICD-10-CM | POA: Diagnosis not present

## 2014-10-24 DIAGNOSIS — M623 Immobility syndrome (paraplegic): Secondary | ICD-10-CM | POA: Diagnosis not present

## 2014-10-24 DIAGNOSIS — M2560 Stiffness of unspecified joint, not elsewhere classified: Secondary | ICD-10-CM

## 2014-10-24 NOTE — Therapy (Signed)
[facility] [address] [place], [place], [address] Phone: [phone]   Fax:  [phone]  Physical Therapy Treatment  Patient Details  Name: Jessica Cunningham MRN: [id number] Date of Birth: 1975/06/08 Referring Provider:  Rodolph Bongorey, Evan S, MD  Encounter Date: 10/24/2014      PT End of Session - 10/24/14 1407    Visit Number 3   Number of Visits 12   PT Start Time 1405   PT Stop Time 1500   PT Time Calculation (min) 55 min   Activity Tolerance --  Pt limited by increased radicular symptoms with certain activities.       Past Medical History  Diagnosis Date  . Depression   . Allergy   . Hyperlipidemia     Past Surgical History  Procedure Laterality Date  . Tonsillectomy    . Ltcs      x2  . Cesarean section      There were no vitals filed for this visit.  Visit Diagnosis:  Spinal pain  Stiffness due to immobility  Abnormal posture  Impaired functional mobility and endurance      Subjective Assessment - 10/24/14 1408    Subjective Pt reports she rode her bike yesterday and after 1/2 mile had tingling in both hands (1st, 2nd finger).  Today, feeling tingling in ring and 5th finger.    Currently in Pain? Yes   Pain Score 3    Pain Location Back  In between shoulder blades   Pain Orientation Right;Left   Pain Descriptors / Indicators Sore  "Like a knot"             [facility] Adult PT Treatment/Exercise - 10/24/14 0001    Exercises   Exercises Shoulder;Neck   Neck Exercises: Machines for Strengthening   UBE (Upper Arm Bike) L1: forward 1.5 min (standing) hands increased in tingles - reversed direction, tingling continued.- stopped   Neck Exercises: Supine   Neck Retraction 5 reps;10 secs   Shoulder Flexion Right;Left;5 reps  on foam roll; core engaged.    Other Supine Exercise Hooklying on full foam roll:  horizontal abd with shoulders, then 10 gentle snow angels.     Shoulder Exercises: Supine    Horizontal ABduction Both;10 reps   Theraband Level (Shoulder Horizontal ABduction) Level 2 (Red)   Flexion Both;Theraband;5 reps   Theraband Level (Shoulder Flexion) Level 2 (Red)   Flexion Limitations tingles in hnds -topped   Shoulder Exercises: Seated   Row Both;12 reps;Theraband   Theraband Level (Shoulder Row) Level 2 (Red)   Shoulder Exercises: Sidelying   Other Sidelying Exercises Thoracic rotation with shoulder horizontal abd x 8 reps each side.    Cryotherapy   Number Minutes Cryotherapy 15 Minutes   Cryotherapy Location Cervical  thoracic   Type of Cryotherapy Ice pack   Electrical Stimulation   Electrical Stimulation Location bilateral suboccipitals and thoracic paraspinals   Electrical Stimulation Action IFC   Electrical Stimulation Parameters to tolerance    Electrical Stimulation Goals Pain   Manual Therapy   Manual therapy comments Pt instructed in MFR to ant neck muscles; pt returned demo with Rt/Lt lateral cervical flexion   Soft tissue mobilization to Rt periscapular area    Myofascial Release suboccipital release                      PT Long Term Goals - 10/16/14 0949    PT LONG TERM GOAL #1   Title Pt  I in HEP 11/27/14   Time 6   Period Weeks   Status New   PT LONG TERM GOAL #2   Title Improve posture and alignment with good control of upper core postural musculature 11/27/14   Time 6   Period Weeks   Status New   PT LONG TERM GOAL #3   Title Increase cervical ROM by 5-15 degrees 11/27/14   Time 6   Period Weeks   Status New   PT LONG TERM GOAL #4   Title Decrease frequency, intensity and duration of pain and HA's by 50% to 4/10 or less 11/27/14   Time 6   Period Weeks   Status New   PT LONG TERM GOAL #5   Title Decrease FOTO to </=32% limitaion 11/27/14   Time 6   Period Weeks   Status New        Problem List Patient Active Problem List   Diagnosis Date Noted  . Back pain 10/14/2014  . Tenderness of lymph node  10/04/2014  . Bilateral hand numbness 10/04/2014  . Cervical radiculopathy 10/04/2014  . Migraine without aura, not intractable 09/02/2014  . Right lateral epicondylitis 01/14/2014  . Acute depression 10/04/2008  . ALLERGIC RHINITIS CAUSE UNSPECIFIED 10/04/2008    Mayer Camel, PTA 10/24/2014 3:16 PM  [facility] [address] [place], [place], [address] Phone: [phone]   Fax:  [phone]

## 2014-10-31 ENCOUNTER — Ambulatory Visit (INDEPENDENT_AMBULATORY_CARE_PROVIDER_SITE_OTHER): Payer: BLUE CROSS/BLUE SHIELD | Admitting: Physical Therapy

## 2014-10-31 DIAGNOSIS — Z7409 Other reduced mobility: Secondary | ICD-10-CM

## 2014-10-31 DIAGNOSIS — M623 Immobility syndrome (paraplegic): Secondary | ICD-10-CM | POA: Diagnosis not present

## 2014-10-31 DIAGNOSIS — M549 Dorsalgia, unspecified: Secondary | ICD-10-CM | POA: Diagnosis not present

## 2014-10-31 DIAGNOSIS — R293 Abnormal posture: Secondary | ICD-10-CM | POA: Diagnosis not present

## 2014-10-31 DIAGNOSIS — M2560 Stiffness of unspecified joint, not elsewhere classified: Secondary | ICD-10-CM

## 2014-10-31 DIAGNOSIS — G44229 Chronic tension-type headache, not intractable: Secondary | ICD-10-CM

## 2014-10-31 NOTE — Therapy (Signed)
[place], [place], [address] Phone: [phone]   Fax:  [phone]  Physical Therapy Treatment  Patient Details  Name: Jessica Cunningham MRN: [id number] Date of Birth: Oct 04, 1975 No Data Recorded  Encounter Date: 10/31/2014      PT End of Session - 10/31/14 1158    Visit Number 4   Number of Visits 12   Date for PT Re-Evaluation 11/27/14   PT Start Time 3419   PT Stop Time 6222   PT Time Calculation (min) 53 min   Activity Tolerance Patient tolerated treatment well;No increased pain      Past Medical History  Diagnosis Date  . Depression   . Allergy   . Hyperlipidemia     Past Surgical History  Procedure Laterality Date  . Tonsillectomy    . Ltcs      x2  . Cesarean section      There were no vitals filed for this visit.  Visit Diagnosis:  Spinal pain  Stiffness due to immobility  Abnormal posture  Impaired functional mobility and endurance  Chronic tension-type headache, not intractable      Subjective Assessment - 10/31/14 1153    Subjective "The knot in Rt shoulder is gone now".  Pt continues with pain in occipital muscle on Rt.   Tingles in Rt/Lt hand persists.    Currently in Pain? Yes   Pain Score 2    Pain Location Neck   Pain Orientation Right   Pain Descriptors / Indicators --  "pinching"   Aggravating Factors  stress   Pain Relieving Factors ice, rest, aleve.             [facility] PT Assessment - 10/31/14 0001    Assessment   Medical Diagnosis bilat thoracic pain/ neck pain/ headaches   Onset Date/Surgical Date 04/27/14   Hand Dominance Right   Next MD Visit need to schedule           [facility] Adult PT Treatment/Exercise - 10/31/14 0001    Neck Exercises: Machines for Strengthening   UBE (Upper Arm Bike) L2: 4 min - alternating    Shoulder Exercises: Seated   Row Both;Theraband;10 reps  2 sets   Theraband Level (Shoulder Row) Level 2 (Red)    External Rotation Strengthening;Both;10 reps;Theraband  2 sets   Theraband Level (Shoulder External Rotation) Level 2 (Red)   Shoulder Exercises: Sidelying   Other Sidelying Exercises Thoracic rotation with shoulder horizontal abd x 7 reps each side; repeated it with red band x 5 reps each side.    Cryotherapy   Number Minutes Cryotherapy 15 Minutes   Cryotherapy Location Cervical   Type of Cryotherapy Ice pack   Electrical Stimulation   Electrical Stimulation Location Rt cervical/trap area   Electrical Stimulation Action IFC    Electrical Stimulation Parameters to tolerance    Electrical Stimulation Goals Pain   Manual Therapy   Myofascial Release suboccipital release, tightness noted on Rt    Passive ROM cervical stretching   Manual Traction cervical   Neck Exercises: Stretches   Upper Trapezius Stretch 2 reps;30 seconds  each side with towel assist   Levator Stretch 2 reps;30 seconds   Corner Stretch Limitations 3 way doorway 10 sec as tol with UE parasthesias 2 reps each position.           PT Long Term Goals - 10/31/14 1238    PT LONG TERM GOAL #1   Title Pt I in  HEP 11/27/14   Time 6   Period Weeks   Status On-going   PT LONG TERM GOAL #2   Title Improve posture and alignment with good control of upper core postural musculature 11/27/14   Time 6   Period Weeks   Status On-going   PT LONG TERM GOAL #3   Title Increase cervical ROM by 5-15 degrees 11/27/14   Time 6   Period Weeks   Status On-going   PT LONG TERM GOAL #4   Title Decrease frequency, intensity and duration of pain and HA's by 50% to 4/10 or less 11/27/14   Time 6   Period Weeks   Status Achieved   PT LONG TERM GOAL #5   Title Decrease FOTO to </=32% limitaion 11/27/14   Time 6   Period Weeks   Status On-going               Plan - 10/31/14 1235    Clinical Impression Statement Pt tolerated new exercises without increase in pain.  Pt noted reduction of symptoms with neck and LE  stretches; further reduction with ice and estim at end of session.  Pt has met LTG # 4.    Pt will benefit from skilled therapeutic intervention in order to improve on the following deficits Postural dysfunction;Improper body mechanics;Increased fascial restricitons;Pain;Decreased range of motion;Decreased mobility;Decreased endurance;Decreased activity tolerance   Rehab Potential Good   PT Frequency 2x / week   PT Duration 6 weeks   PT Treatment/Interventions Patient/family education;ADLs/Self Care Home Management;Manual techniques;Dry needling;Therapeutic activities;Therapeutic exercise;Cryotherapy;Electrical Stimulation;Moist Heat;Ultrasound   PT Next Visit Plan review HEP; continue postural education and correction; progress with stretching; posterior shoulder girdle strengthening; add prolonged snow angel stretch   Consulted and Agree with Plan of Care Patient        Problem List Patient Active Problem List   Diagnosis Date Noted  . Back pain 10/14/2014  . Tenderness of lymph node 10/04/2014  . Bilateral hand numbness 10/04/2014  . Cervical radiculopathy 10/04/2014  . Migraine without aura, not intractable 09/02/2014  . Right lateral epicondylitis 01/14/2014  . Acute depression 10/04/2008  . ALLERGIC RHINITIS CAUSE UNSPECIFIED 10/04/2008    Kerin Perna, PTA 10/31/2014 12:39 PM  [place], [place], [address] Phone: [phone]   Fax:  [phone]  Name: Geovana Gebel MRN: [id number] Date of Birth: 04-Jan-1976

## 2014-11-04 ENCOUNTER — Ambulatory Visit (INDEPENDENT_AMBULATORY_CARE_PROVIDER_SITE_OTHER): Payer: BLUE CROSS/BLUE SHIELD | Admitting: Physical Therapy

## 2014-11-04 DIAGNOSIS — M623 Immobility syndrome (paraplegic): Secondary | ICD-10-CM

## 2014-11-04 DIAGNOSIS — Z7409 Other reduced mobility: Secondary | ICD-10-CM | POA: Diagnosis not present

## 2014-11-04 DIAGNOSIS — R293 Abnormal posture: Secondary | ICD-10-CM

## 2014-11-04 DIAGNOSIS — M2560 Stiffness of unspecified joint, not elsewhere classified: Secondary | ICD-10-CM

## 2014-11-04 DIAGNOSIS — M549 Dorsalgia, unspecified: Secondary | ICD-10-CM

## 2014-11-04 NOTE — Therapy (Addendum)
[place], [place], [address] Phone: [phone]   Fax:  [phone]  Physical Therapy Treatment  Patient Details  Name: Jessica Cunningham MRN: [id number] Date of Birth: 1975-08-04 No Data Recorded  Encounter Date: 11/04/2014      PT End of Session - 11/04/14 1154    Visit Number 5   Number of Visits 12   Date for PT Re-Evaluation 11/27/14   PT Start Time 1150   PT Stop Time 5361   PT Time Calculation (min) 51 min   Activity Tolerance No increased pain  Varying intensity of radicular symptoms.       Past Medical History  Diagnosis Date  . Depression   . Allergy   . Hyperlipidemia     Past Surgical History  Procedure Laterality Date  . Tonsillectomy    . Ltcs      x2  . Cesarean section      There were no vitals filed for this visit.  Visit Diagnosis:  Stiffness due to immobility  Spinal pain  Abnormal posture  Impaired functional mobility and endurance      Subjective Assessment - 11/04/14 1154    Subjective Pt reports she had gone camping this past weekend and was using her UE a lot.  Pain in neck gone; tingling in Rt/Lt hands persists. (at beginning of session 8/10 tingling bilat; during treatment went as low as 1/10)   Patient Stated Goals would like to find ways to help the pain in her neck and upper back   Currently in Pain? No/denies            [facility] PT Assessment - 11/04/14 0001    Assessment   Medical Diagnosis bilat thoracic pain/ neck pain/ headaches   Onset Date/Surgical Date 04/27/14   Hand Dominance Right   Next MD Visit need to schedule            [facility] Adult PT Treatment/Exercise - 11/04/14 0001    Neck Exercises: Machines for Strengthening   UBE (Upper Arm Bike) L2: 3 min alternating   Neck Exercises: Supine   Upper Extremity D2 10 reps;Flexion  2 sets each side   Theraband Level (UE D2) Level 2 (Red)   Other Supine Exercise Hooklying on  1/2 foam roll: prolonged stretch with arms horizontally abd (abd to 80 deg), then with wrist flex/ext    Other Supine Exercise Hooklying: shoulder ER bilat with red band x 10 reps x 2 sets.    Shoulder Exercises: Seated   Elevation 10 reps;Strengthening;Both;Weights   Other Seated Exercises Rt UE nerve glides with wrist flexion/ ext x 10 reps    Shoulder Exercises: Standing   Row Strengthening;Both;10 reps   Theraband Level (Shoulder Row) Level 2 (Red)   Modalities   Modalities --  Pt declined   Manual Therapy   Myofascial Release to Rt/Lt forearm (supine: horiz abd to 90 deg), Rt / Lt upper trap, Rt pec    Neck Exercises: Stretches   Upper Trapezius Stretch 2 reps;30 seconds  each side with towel assist   Levator Stretch 2 reps;30 seconds   Corner Stretch Limitations 3 way doorway 10 sec as tol with UE parasthesias 2 reps each position                PT Education - 11/04/14 1330    Education provided Yes   Education Details Added Sash and ER in supine with red band   Person(s) Educated  Patient   Methods Explanation;Handout   Comprehension Verbalized understanding;Returned demonstration             PT Long Term Goals - 10/31/14 1238    PT LONG TERM GOAL #1   Title Pt I in HEP 11/27/14   Time 6   Period Weeks   Status On-going   PT LONG TERM GOAL #2   Title Improve posture and alignment with good control of upper core postural musculature 11/27/14   Time 6   Period Weeks   Status On-going   PT LONG TERM GOAL #3   Title Increase cervical ROM by 5-15 degrees 11/27/14   Time 6   Period Weeks   Status On-going   PT LONG TERM GOAL #4   Title Decrease frequency, intensity and duration of pain and HA's by 50% to 4/10 or less 11/27/14   Time 6   Period Weeks   Status Achieved   PT LONG TERM GOAL #5   Title Decrease FOTO to </=32% limitaion 11/27/14   Time 6   Period Weeks   Status On-going               Plan - 11/04/14 1245    Clinical Impression  Statement Pt reports increased radicular symptoms in UE (Rt>Lt) with exercise of UE in standing, less in supine.  Pt reported slight increase in symptoms in fingers during MFR to forearms, and reduction of baseline symptoms afterwards.  Pt tolerated new exercises without production of pain, however varying level of radicular symptoms during activity. Pt progressing well towards goals.    Pt will benefit from skilled therapeutic intervention in order to improve on the following deficits Postural dysfunction;Improper body mechanics;Increased fascial restricitons;Pain;Decreased range of motion;Decreased mobility;Decreased endurance;Decreased activity tolerance   Rehab Potential Good   PT Frequency 2x / week   PT Duration 6 weeks   PT Treatment/Interventions Patient/family education;ADLs/Self Care Home Management;Manual techniques;Dry needling;Therapeutic activities;Therapeutic exercise;Cryotherapy;Electrical Stimulation;Moist Heat;Ultrasound   PT Next Visit Plan Continue postural education and correction. Trial manual traction to reduce radicular symptoms.  Modalities PRN.    Consulted and Agree with Plan of Care Patient        Problem List Patient Active Problem List   Diagnosis Date Noted  . Back pain 10/14/2014  . Tenderness of lymph node 10/04/2014  . Bilateral hand numbness 10/04/2014  . Cervical radiculopathy 10/04/2014  . Migraine without aura, not intractable 09/02/2014  . Right lateral epicondylitis 01/14/2014  . Acute depression 10/04/2008  . ALLERGIC RHINITIS CAUSE UNSPECIFIED 10/04/2008    Jennifer Carlson-Long, PTA 11/04/2014 1:39 PM  [facility] [address] [place], [place], [address] Phone: [phone]   Fax:  [phone]  Name: Jessica Cunningham MRN: [id number] Date of Birth: 05/27/1975    PHYSICAL THERAPY DISCHARGE SUMMARY  Visits from Start of Care: 5  Current functional level related to goals /  functional outcomes: Improving mobility and functional activity level. Patient decided she did not wish to continue treatment at this time.    Remaining deficits: Some persistent pain and limitations   Education / Equipment: HEP/therabands  Plan: Patient agrees to discharge.  Patient goals were partially met. Patient is being discharged due to the patient's request.  ?????    Celyn P. Holt PT, MPH 12/09/2014 9:01 AM   

## 2014-11-04 NOTE — Patient Instructions (Signed)
  Sash   On back, knees bent, feet flat, left hand on left hip, right hand above left. Pull right arm DIAGONALLY (hip to shoulder) across chest. Bring right arm along head toward floor. Hold momentarily. Slowly return to starting position. Repeat _10__ times, 2 sets. Do with left arm. Band color ___red___   Shoulder Rotation: Double Arm   On back, knees bent, feet flat, elbows tucked at sides, bent 90, hands palms up. Pull hands apart and down toward floor, keeping elbows near sides. Hold momentarily. Slowly return to starting position. Repeat _10__ times, 2 sets. Band color __red ____    [facility] [address] [place], [place] [address]  [phone] (office) [phone] (fax)

## 2014-11-16 ENCOUNTER — Other Ambulatory Visit: Payer: Self-pay | Admitting: Family Medicine

## 2014-11-22 ENCOUNTER — Other Ambulatory Visit: Payer: Self-pay | Admitting: Family Medicine

## 2014-11-22 ENCOUNTER — Encounter: Payer: Self-pay | Admitting: Family Medicine

## 2014-11-22 ENCOUNTER — Ambulatory Visit (INDEPENDENT_AMBULATORY_CARE_PROVIDER_SITE_OTHER): Payer: BLUE CROSS/BLUE SHIELD | Admitting: Family Medicine

## 2014-11-22 ENCOUNTER — Ambulatory Visit (INDEPENDENT_AMBULATORY_CARE_PROVIDER_SITE_OTHER): Payer: BLUE CROSS/BLUE SHIELD

## 2014-11-22 VITALS — BP 123/85 | HR 87 | Temp 98.6°F | Resp 18 | Wt 204.1 lb

## 2014-11-22 DIAGNOSIS — Z1231 Encounter for screening mammogram for malignant neoplasm of breast: Secondary | ICD-10-CM

## 2014-11-22 DIAGNOSIS — R1031 Right lower quadrant pain: Secondary | ICD-10-CM | POA: Diagnosis not present

## 2014-11-22 MED ORDER — IOHEXOL 300 MG/ML  SOLN
50.0000 mL | Freq: Once | INTRAMUSCULAR | Status: AC | PRN
  Administered 2014-11-22: 50 mL via ORAL

## 2014-11-22 MED ORDER — IOHEXOL 300 MG/ML  SOLN
100.0000 mL | Freq: Once | INTRAMUSCULAR | Status: AC | PRN
  Administered 2014-11-22: 100 mL via INTRAVENOUS

## 2014-11-22 NOTE — Progress Notes (Signed)
Subjective:    Patient ID: Jessica Cunningham, female    DOB: [birth year], 39 y.o.   MRN: [id number]  HPI Right sided abdominal pain started 2 days ago. Then had some diarrhea that night.  Hasn't had a BM since then.  No fever.  + chills.  Dec appetite. No dysuria or hematuria.  She says it is a dull constant ache but if moves a certain way it is a sharp pain.  She rates her pain a 7 out of 10. She has never had anything like this before.  Review of Systems  BP 123/85 mmHg  Pulse 87  Temp(Src) 98.6 F (37 C) (Oral)  Resp 18  Wt 204 lb 1.6 oz (92.579 kg)  SpO2 100%    Allergies  Allergen Reactions  . Paxil [Paroxetine Hcl] Other (See Comments)    Made her "crazy", hallucination  . Topamax [Topiramate]     Forgetful/not been able to complete sentences.   . Wellbutrin [Bupropion] Other (See Comments)    Hand shakes    Past Medical History  Diagnosis Date  . Depression   . Allergy   . Hyperlipidemia     Past Surgical History  Procedure Laterality Date  . Tonsillectomy    . Ltcs      x2  . Cesarean section      Social History   Social History  . Marital Status: Married    Spouse Name: N/A  . Number of Children: N/A  . Years of Education: N/A   Occupational History  . Not on file.   Social History Main Topics  . Smoking status: Former Smoker    Quit date: 01/11/2001  . Smokeless tobacco: Not on file  . Alcohol Use: Yes  . Drug Use: No  . Sexual Activity: Not on file   Other Topics Concern  . Not on file   Social History Narrative    No family history on file.  Outpatient Encounter Prescriptions as of 11/22/2014  Medication Sig  . Beclomethasone Dipropionate (QNASL) 80 MCG/ACT AERS Place 1 spray into both nostrils daily.  Marland Kitchen esomeprazole (NEXIUM) 20 MG capsule TAKE ONE CAPSULE BY MOUTH EVERY DAY AS NEEDED  . fexofenadine (ALLEGRA) 180 MG tablet Take 180 mg by mouth daily.  . montelukast (SINGULAIR) 10 MG tablet TAKE 1 TABLET (10 MG TOTAL) BY MOUTH AT  BEDTIME.  Marland Kitchen sertraline (ZOLOFT) 50 MG tablet Take 1 and 1/2 tablet daily.  . [DISCONTINUED] predniSONE (DELTASONE) 20 MG tablet Take 3 tablets for 3 days, take 2 tablets for 3 days, take 1 tablet for 3 days, take 1/2 tablet for 4 days. (Patient not taking: Reported on 10/16/2014)   No facility-administered encounter medications on file as of 11/22/2014.          Objective:   Physical Exam  Constitutional: She is oriented to person, place, and time. She appears well-developed and well-nourished.  HENT:  Head: Normocephalic and atraumatic.  Cardiovascular: Normal rate, regular rhythm and normal heart sounds.   Pulmonary/Chest: Effort normal and breath sounds normal.  Abdominal: Bowel sounds are normal. She exhibits no distension and no mass. There is tenderness. There is no rebound and no guarding.  Very tender in the RLQ and around the umbilicus.  Dec bowel sounds.   Neurological: She is alert and oriented to person, place, and time.  Skin: Skin is warm and dry.  Psychiatric: She has a normal mood and affect. Her behavior is normal.  Assessment & Plan:  RLQ pain - worrisome for appendicitis. She does not have any rebound that she is extremely tender with loss of appetite on the change in bowel movement and chills. Recommend CT abdomen and pelvis with contrast for further evaluation.

## 2014-12-11 ENCOUNTER — Ambulatory Visit (INDEPENDENT_AMBULATORY_CARE_PROVIDER_SITE_OTHER): Payer: BLUE CROSS/BLUE SHIELD

## 2014-12-11 DIAGNOSIS — Z1231 Encounter for screening mammogram for malignant neoplasm of breast: Secondary | ICD-10-CM

## 2014-12-17 ENCOUNTER — Ambulatory Visit (INDEPENDENT_AMBULATORY_CARE_PROVIDER_SITE_OTHER): Payer: BLUE CROSS/BLUE SHIELD | Admitting: Family Medicine

## 2014-12-17 ENCOUNTER — Encounter: Payer: Self-pay | Admitting: Family Medicine

## 2014-12-17 ENCOUNTER — Other Ambulatory Visit (HOSPITAL_COMMUNITY)
Admission: RE | Admit: 2014-12-17 | Discharge: 2014-12-17 | Disposition: A | Discharge status: Home / Self Care | Payer: BLUE CROSS/BLUE SHIELD | Source: Ambulatory Visit | Attending: Family Medicine | Admitting: Family Medicine

## 2014-12-17 VITALS — BP 117/64 | HR 73 | Ht 62.0 in | Wt 203.2 lb

## 2014-12-17 DIAGNOSIS — Z01419 Encounter for gynecological examination (general) (routine) without abnormal findings: Secondary | ICD-10-CM | POA: Insufficient documentation

## 2014-12-17 DIAGNOSIS — R799 Abnormal finding of blood chemistry, unspecified: Secondary | ICD-10-CM

## 2014-12-17 DIAGNOSIS — R7989 Other specified abnormal findings of blood chemistry: Secondary | ICD-10-CM

## 2014-12-17 DIAGNOSIS — R790 Abnormal level of blood mineral: Secondary | ICD-10-CM

## 2014-12-17 DIAGNOSIS — Z0000 Encounter for general adult medical examination without abnormal findings: Secondary | ICD-10-CM

## 2014-12-17 DIAGNOSIS — Z1151 Encounter for screening for human papillomavirus (HPV): Secondary | ICD-10-CM | POA: Insufficient documentation

## 2014-12-17 LAB — FERRITIN: FERRITIN: 48 ng/mL (ref 10–291)

## 2014-12-17 LAB — IRON: Iron: 85 ug/dL (ref 40–190)

## 2014-12-17 NOTE — Progress Notes (Signed)
  Subjective:     Jessica Cunningham is a 39 y.o. female and is here for a comprehensive physical exam. The patient reports no problems.  She is not exercising right now.   Social History   Social History  . Marital Status: Married    Spouse Name: N/A  . Number of Children: N/A  . Years of Education: N/A   Occupational History  . Not on file.   Social History Main Topics  . Smoking status: Former Smoker    Quit date: 01/11/2001  . Smokeless tobacco: Not on file  . Alcohol Use: Yes  . Drug Use: No  . Sexual Activity: Not on file   Other Topics Concern  . Not on file   Social History Narrative   Health Maintenance  Topic Date Due  . PAP SMEAR  09/11/2013  . INFLUENZA VACCINE  09/02/2015 (Originally 08/12/2014)  . TETANUS/TDAP  01/12/2016  . HIV Screening  Completed    The following portions of the patient's history were reviewed and updated as appropriate: allergies, current medications, past family history, past medical history, past social history, past surgical history and problem list.  Review of Systems Pertinent items noted in HPI and remainder of comprehensive ROS otherwise negative.   Objective:    BP 117/64 mmHg  Pulse 73  Ht 5\' 2"  (1.575 m)  Wt 203 lb 3.2 oz (92.171 kg)  BMI 37.16 kg/m2  LMP 12/09/2014 (Exact Date) General appearance: alert, cooperative and appears stated age Head: Normocephalic, without obvious abnormality, atraumatic Eyes: conj clear, EOMI, PEERLA Ears: normal TM's and external ear canals both ears Nose: Nares normal. Septum midline. Mucosa normal. No drainage or sinus tenderness. Throat: lips, mucosa, and tongue normal; teeth and gums normal Neck: no adenopathy, no carotid bruit, no JVD, supple, symmetrical, trachea midline and thyroid not enlarged, symmetric, no tenderness/mass/nodules Back: symmetric, no curvature. ROM normal. No CVA tenderness. Lungs: clear to auscultation bilaterally Breasts: normal appearance, no masses or  tenderness Heart: regular rate and rhythm, S1, S2 normal, no murmur, click, rub or gallop Abdomen: soft, non-tender; bowel sounds normal; no masses,  no organomegaly Pelvic: cervix normal in appearance, external genitalia normal, no adnexal masses or tenderness, no cervical motion tenderness, rectovaginal septum normal, uterus normal size, shape, and consistency and vagina normal without discharge Extremities: extremities normal, atraumatic, no cyanosis or edema Pulses: 2+ and symmetric Skin: Skin color, texture, turgor normal. No rashes or lesions Lymph nodes: Cervical, supraclavicular, and axillary nodes normal. Neurologic: Alert and oriented X 3, normal strength and tone. Normal symmetric reflexes. Normal coordination and gait    Assessment:    Healthy female exam.     Plan:     See After Visit Summary for Counseling Recommendations  Keep up a regular exercise program and make sure you are eating a healthy diet Try to eat 4 servings of dairy a day, or if you are lactose intolerant take a calcium with vitamin D daily.  Your vaccines are up to date.    Vitamin D def- recheck levels.  Low ferriting - due to recheck.

## 2014-12-18 LAB — CYTOLOGY - PAP

## 2014-12-18 LAB — VITAMIN D 25 HYDROXY (VIT D DEFICIENCY, FRACTURES): VIT D 25 HYDROXY: 23 ng/mL — AB (ref 30–100)

## 2014-12-20 ENCOUNTER — Telehealth: Payer: Self-pay

## 2014-12-20 NOTE — Telephone Encounter (Signed)
Patient aware of pap results and recommendations.  She is also aware of vitamin d and iron results and recommendations.

## 2014-12-20 NOTE — Telephone Encounter (Signed)
-----   Message from Agapito Gamesatherine D Metheney, MD sent at 12/18/2014  8:15 AM EST ----- Call patient: Vitamin D came up just a little bit. But still not into the normal range. Increase the vitamin D to 2000 international units daily. Then let's recheck levels in 3-4 months. Iron stores look much better. Great job. Continue to take iron for another 3-4 months and at that point can discontinue.

## 2015-03-19 ENCOUNTER — Other Ambulatory Visit: Payer: Self-pay | Admitting: Family Medicine

## 2015-03-24 ENCOUNTER — Other Ambulatory Visit: Payer: Self-pay | Admitting: Family Medicine

## 2015-03-24 ENCOUNTER — Telehealth: Payer: Self-pay | Admitting: Family Medicine

## 2015-03-24 NOTE — Telephone Encounter (Signed)
I called pt and left a message stating that she needs to call and schedule a f/u with Dr. Linford ArnoldMetheney for a f/u on her meds

## 2015-03-24 NOTE — Telephone Encounter (Signed)
Needs f/u appt 

## 2015-04-13 ENCOUNTER — Other Ambulatory Visit: Payer: Self-pay | Admitting: Family Medicine

## 2015-04-21 ENCOUNTER — Encounter: Payer: Self-pay | Admitting: Family Medicine

## 2015-04-21 ENCOUNTER — Ambulatory Visit (INDEPENDENT_AMBULATORY_CARE_PROVIDER_SITE_OTHER): Payer: BLUE CROSS/BLUE SHIELD | Admitting: Family Medicine

## 2015-04-21 VITALS — BP 132/77 | HR 72 | Wt 191.0 lb

## 2015-04-21 DIAGNOSIS — K219 Gastro-esophageal reflux disease without esophagitis: Secondary | ICD-10-CM | POA: Diagnosis not present

## 2015-04-21 DIAGNOSIS — G2581 Restless legs syndrome: Secondary | ICD-10-CM | POA: Diagnosis not present

## 2015-04-21 DIAGNOSIS — F32A Depression, unspecified: Secondary | ICD-10-CM

## 2015-04-21 DIAGNOSIS — R799 Abnormal finding of blood chemistry, unspecified: Secondary | ICD-10-CM | POA: Diagnosis not present

## 2015-04-21 DIAGNOSIS — F329 Major depressive disorder, single episode, unspecified: Secondary | ICD-10-CM | POA: Diagnosis not present

## 2015-04-21 DIAGNOSIS — R7989 Other specified abnormal findings of blood chemistry: Secondary | ICD-10-CM | POA: Diagnosis not present

## 2015-04-21 LAB — BASIC METABOLIC PANEL WITH GFR
BUN: 17 mg/dL (ref 7–25)
CALCIUM: 9.4 mg/dL (ref 8.6–10.2)
CO2: 27 mmol/L (ref 20–31)
CREATININE: 0.57 mg/dL (ref 0.50–1.10)
Chloride: 104 mmol/L (ref 98–110)
GFR, Est African American: >89 mL/min (ref >=60)
GFR, Est Non African American: >89 mL/min (ref >=60)
Glucose, Bld: 102 mg/dL — ABNORMAL HIGH (ref 65–99)
Potassium: 4.3 mmol/L (ref 3.5–5.3)
SODIUM: 140 mmol/L (ref 135–146)

## 2015-04-21 MED ORDER — ROPINIROLE HCL 0.25 MG PO TABS: 0.2500 mg | ORAL_TABLET | Freq: Every day | ORAL | Status: DC

## 2015-04-21 MED ORDER — SERTRALINE HCL 100 MG PO TABS: 100.0000 mg | ORAL_TABLET | Freq: Every day | ORAL | Status: DC

## 2015-04-21 MED ORDER — ESOMEPRAZOLE MAGNESIUM 20 MG PO CPDR
20.0000 mg | DELAYED_RELEASE_CAPSULE | Freq: Every day | ORAL | Status: DC | PRN
Start: 2015-04-21 — End: 2015-10-14

## 2015-04-21 MED ORDER — TRIAMCINOLONE ACETONIDE 0.1 % EX CREA: 1.0000 "application " | TOPICAL_CREAM | Freq: Every day | CUTANEOUS | Status: DC | PRN

## 2015-04-21 NOTE — Progress Notes (Signed)
   Subjective:    Patient ID: Jessica Cunningham, female    DOB: 02-Mar-1975, 40 y.o.   MRN: [id number]  HPI Follow-up depression-she's currently on Zoloft 75 mg daily. She does complain of little interest or pleasure doing things several days a week and feeling down more than half the days. She denies any thoughts of wanting to harm herself.  Follow-up GERD-she would like a refill refill on her Nexium 20 mg today.  Vitamin D deficiency-recheck her level in December and her level was 23. I recommended over-the-counter 2000 international units daily she is due to recheck levels today.  She also has difficulty at night, sleep because she feels like her legs want to move. She says even prolonged sitting such as sitting in church she'll just feel like a tingling sensation like she has to move them. Lately this has been more bothersome and keeping her awake at night.   Review of Systems     Objective:   Physical Exam  Constitutional: She is oriented to person, place, and time. She appears well-developed and well-nourished.  HENT:  Head: Normocephalic and atraumatic.  Cardiovascular: Normal rate, regular rhythm and normal heart sounds.   Pulmonary/Chest: Effort normal and breath sounds normal.  Neurological: She is alert and oriented to person, place, and time.  Skin: Skin is warm and dry.  Psychiatric: She has a normal mood and affect. Her behavior is normal.          Assessment & Plan:  Depression-PHQ 9 score of 9 today, previous of 4. She rates her symptoms is somewhat difficult. Discussed options including increasing her sertraline. Next  GERD-symptoms well controlled on current regimen. Refill Nexium today.  Vitamin D deficiency - she has been taking a supplement. Due to recheck levels.  Restless leg syndrome-recent iron levels were borderline low but she has been taking an iron supplement. Discussed treatment options. Recommend a trial of her parent all. Side effects discussed.  Follow-up in 6 weeks.

## 2015-04-22 LAB — VITAMIN D 25 HYDROXY (VIT D DEFICIENCY, FRACTURES): Vit D, 25-Hydroxy: 32 ng/mL (ref 30–100)

## 2015-05-21 ENCOUNTER — Other Ambulatory Visit: Payer: Self-pay | Admitting: *Deleted

## 2015-05-21 MED ORDER — ROPINIROLE HCL 0.25 MG PO TABS: 0.2500 mg | ORAL_TABLET | Freq: Every day | ORAL | Status: DC

## 2015-06-02 ENCOUNTER — Ambulatory Visit (INDEPENDENT_AMBULATORY_CARE_PROVIDER_SITE_OTHER): Payer: BLUE CROSS/BLUE SHIELD | Admitting: Family Medicine

## 2015-06-02 ENCOUNTER — Encounter: Payer: Self-pay | Admitting: Family Medicine

## 2015-06-02 VITALS — BP 117/66 | HR 73 | Wt 196.0 lb

## 2015-06-02 DIAGNOSIS — F32A Depression, unspecified: Secondary | ICD-10-CM

## 2015-06-02 DIAGNOSIS — F329 Major depressive disorder, single episode, unspecified: Secondary | ICD-10-CM | POA: Diagnosis not present

## 2015-06-02 DIAGNOSIS — G2581 Restless legs syndrome: Secondary | ICD-10-CM

## 2015-06-02 NOTE — Progress Notes (Signed)
   Subjective:    Patient ID: Jessica Cunningham, female    DOB: 25-Feb-1975, 40 y.o.   MRN: [id number]  HPI F/U depression - She is currently on Zoloft 100 mg daily. Her last PHQ 9 score was elevated at 9 up from her previous of force we have discussed possibly adjusting her medication regimen. We inc her sertraline from 75 to 100mg .  She denies any particular stressors. She denies any side effects on the increased regimen. She does still complain of decreased pleasure in doing things several days of the week and low energy.  F/U RLS - she is currently on ropinirole 0.25 mg daily. We recently started treatment about 6 weeks ago. She says she really is using it as needed. Sometimes she doesn't take it at all and other times she takes 1 or 2 tabs depending on how much energy she feels she has in her legs.   Review of Systems     Objective:   Physical Exam  Constitutional: She is oriented to person, place, and time. She appears well-developed and well-nourished.  HENT:  Head: Normocephalic and atraumatic.  Cardiovascular: Normal rate, regular rhythm and normal heart sounds.   Pulmonary/Chest: Effort normal and breath sounds normal.  Neurological: She is alert and oriented to person, place, and time.  Skin: Skin is warm and dry.  Psychiatric: She has a normal mood and affect. Her behavior is normal.          Assessment & Plan:  Depression - Doing fantastic. PHQ 9 score is down to 3 which was down from previous of 9. She's feeling well on the increased dose without any side effects or problems. Continue current regimen. Follow-up in 6 months. Please return sooner if any palms or concerns. She rates her symptoms as not difficult at all.  RLS - continue current regimen since it seems to be working well without any significant side effects. Discussed with her that if her symptoms become more persistent and she may want to take it more consistently each night. Otherwise I will see her back in 6  months.

## 2015-10-14 ENCOUNTER — Other Ambulatory Visit: Payer: Self-pay | Admitting: Family Medicine

## 2015-10-14 DIAGNOSIS — F329 Major depressive disorder, single episode, unspecified: Secondary | ICD-10-CM

## 2015-10-14 DIAGNOSIS — F32A Depression, unspecified: Secondary | ICD-10-CM

## 2015-10-14 DIAGNOSIS — K219 Gastro-esophageal reflux disease without esophagitis: Secondary | ICD-10-CM

## 2015-10-16 ENCOUNTER — Other Ambulatory Visit: Payer: Self-pay | Admitting: Family Medicine

## 2015-10-16 DIAGNOSIS — F32A Depression, unspecified: Secondary | ICD-10-CM

## 2015-10-16 DIAGNOSIS — F329 Major depressive disorder, single episode, unspecified: Secondary | ICD-10-CM

## 2015-11-16 ENCOUNTER — Other Ambulatory Visit: Payer: Self-pay | Admitting: Family Medicine

## 2015-11-16 DIAGNOSIS — K219 Gastro-esophageal reflux disease without esophagitis: Secondary | ICD-10-CM

## 2015-11-19 DIAGNOSIS — Z1283 Encounter for screening for malignant neoplasm of skin: Secondary | ICD-10-CM | POA: Diagnosis not present

## 2015-11-19 DIAGNOSIS — L578 Other skin changes due to chronic exposure to nonionizing radiation: Secondary | ICD-10-CM | POA: Diagnosis not present

## 2015-11-22 ENCOUNTER — Other Ambulatory Visit: Payer: Self-pay | Admitting: Family Medicine

## 2015-11-22 DIAGNOSIS — F32A Depression, unspecified: Secondary | ICD-10-CM

## 2015-11-22 DIAGNOSIS — F329 Major depressive disorder, single episode, unspecified: Secondary | ICD-10-CM

## 2015-11-28 IMAGING — CT CT ABD-PELV W/ CM
2 of 4 series · 14 of 32 positions shown, 19 images · IV contrast (omnipaque)
Comparison: 100 mL of Omnipaque 300 intravenous contrast

CLINICAL DATA: Right lower quadrant pain and tenderness for 2 days.
Some diarrhea.

EXAM:
CT ABDOMEN AND PELVIS WITH CONTRAST
TECHNIQUE: Multidetector CT imaging of the abdomen and pelvis was performed
using the standard protocol following bolus administration of
intravenous contrast.
CONTRAST:  50mL OMNIPAQUE IOHEXOL 300 MG/ML SOLN, 100mL OMNIPAQUE
IOHEXOL 300 MG/ML SOLN

[Series 2: abd/pelvis with · axial · 0.78mm/px · z∈[-452,-122]mm · 6 of 94 slices shown, 11 images]
[im 14/94  soft-tissue]
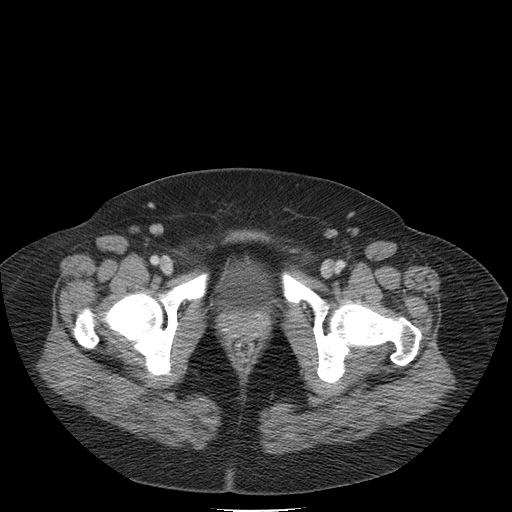
[im 14/94  bone]
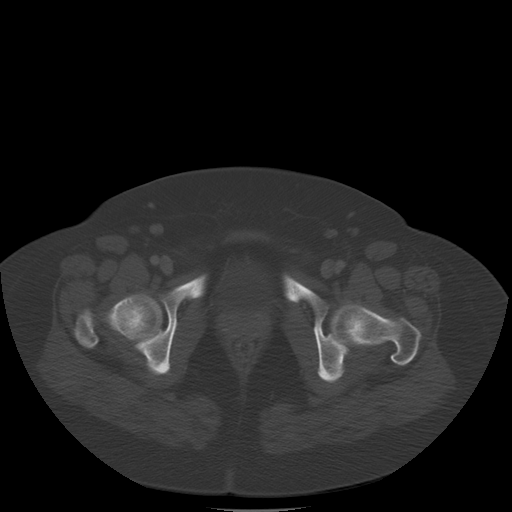
[im 27/94  soft-tissue]
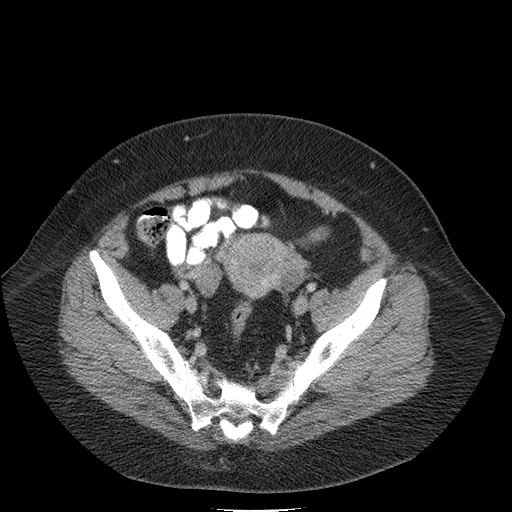
[im 40/94  soft-tissue]
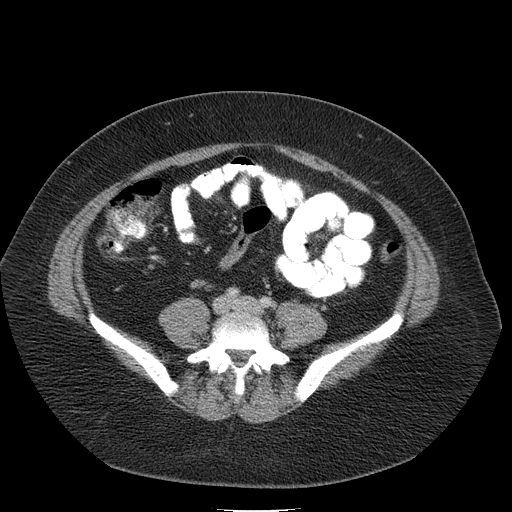
[im 40/94  lung]
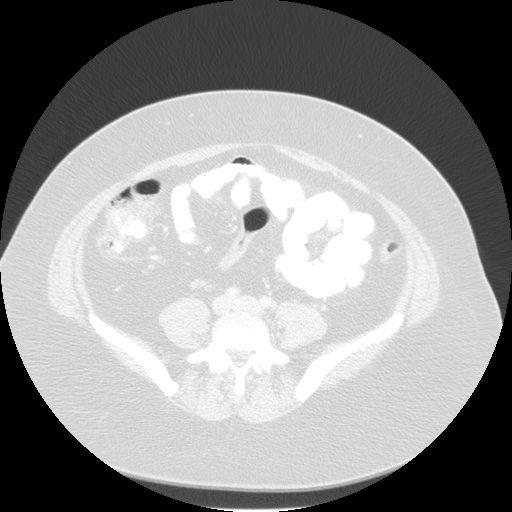
[im 54/94  soft-tissue]
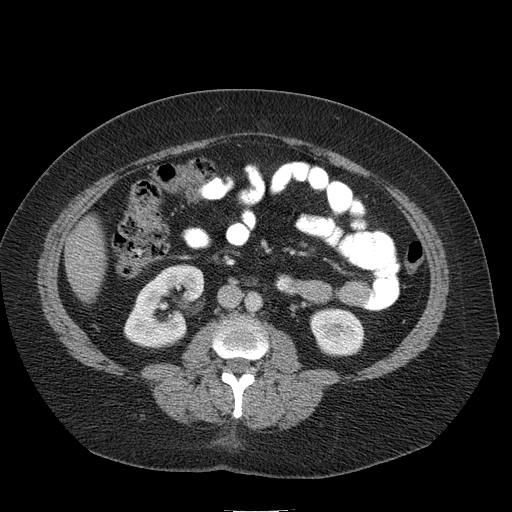
[im 54/94  lung]
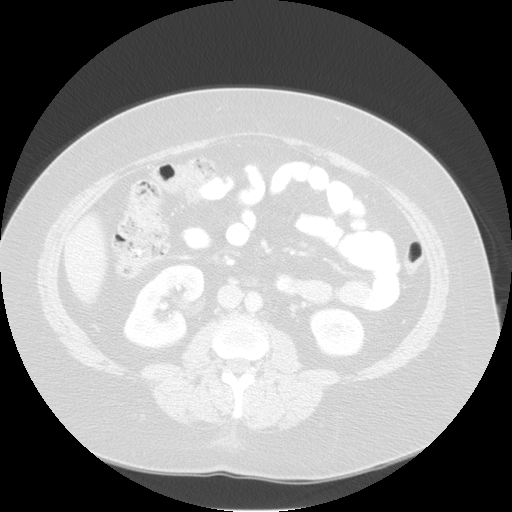
[im 67/94  soft-tissue]
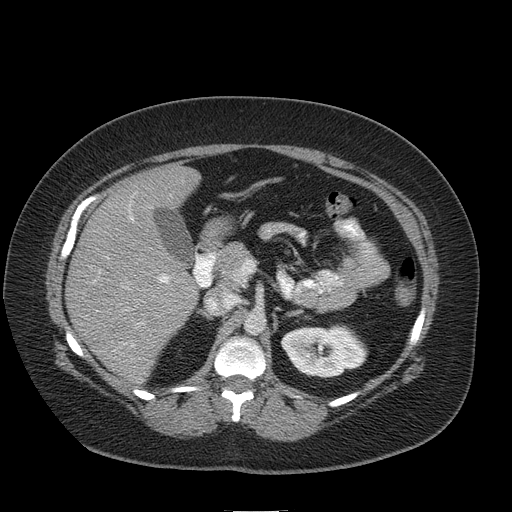
[im 67/94  lung]
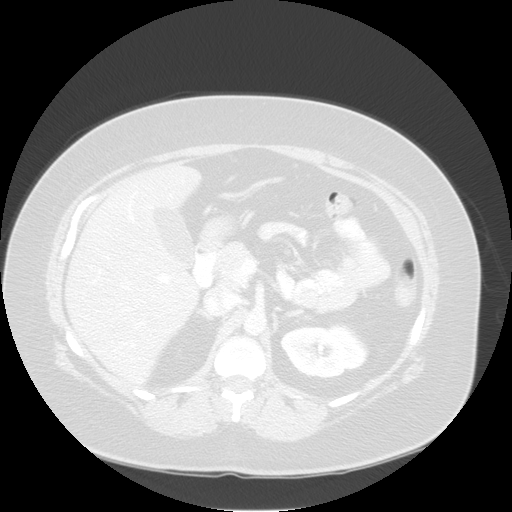
[im 80/94  soft-tissue]
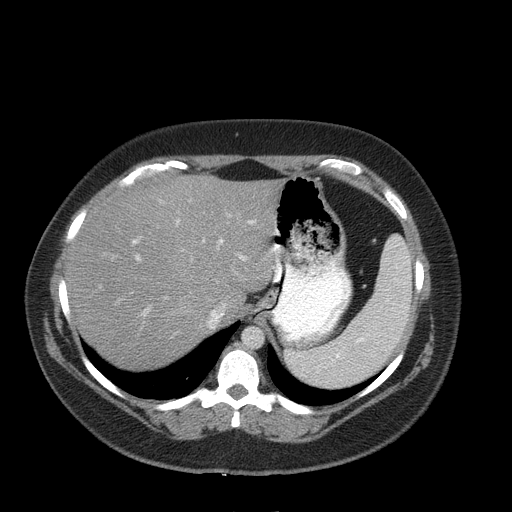
[im 80/94  lung]
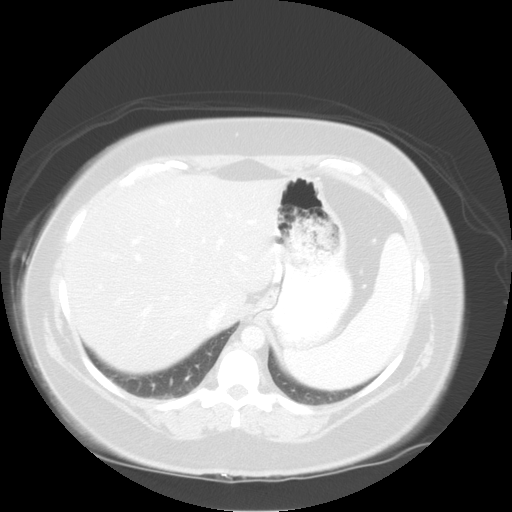

[Series 400: sagittal · sagittal · 0.94mm/px · 8 of 159 slices shown]
[im 15/159  soft-tissue]
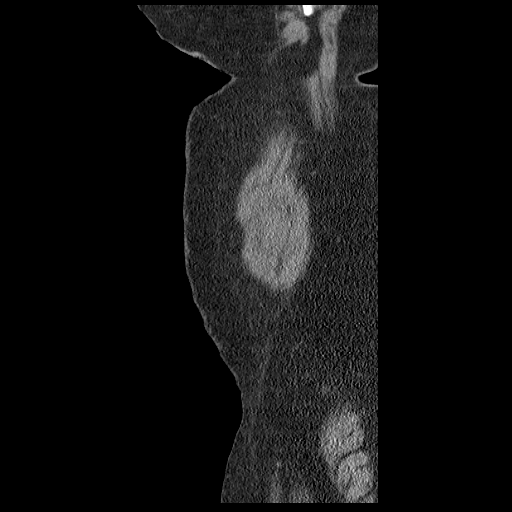
[im 29/159  soft-tissue]
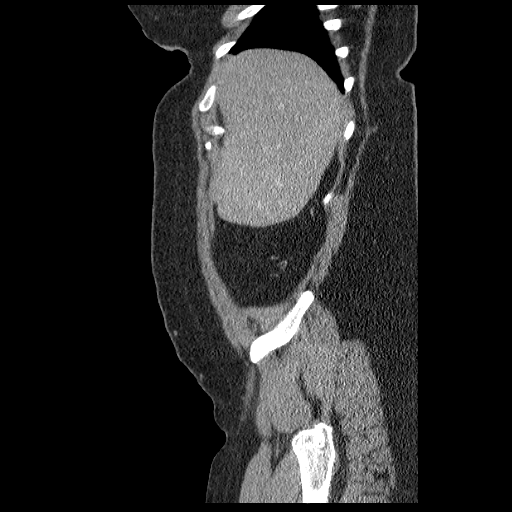
[im 58/159  soft-tissue]
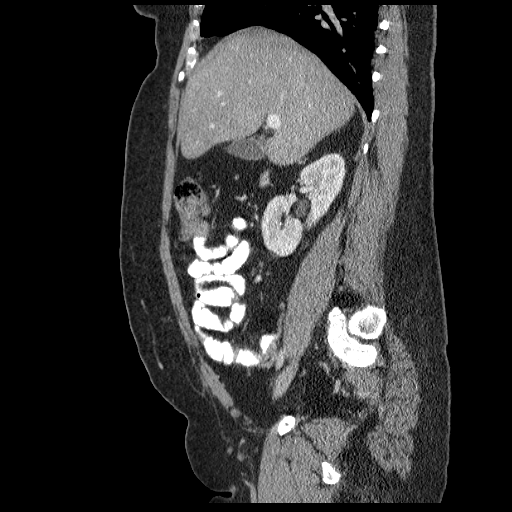
[im 72/159  soft-tissue]
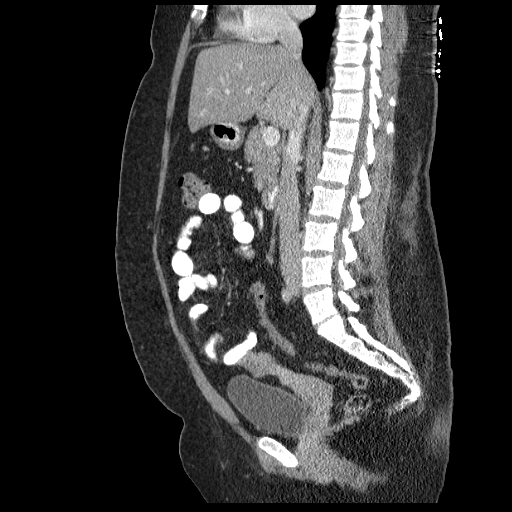
[im 87/159  soft-tissue]
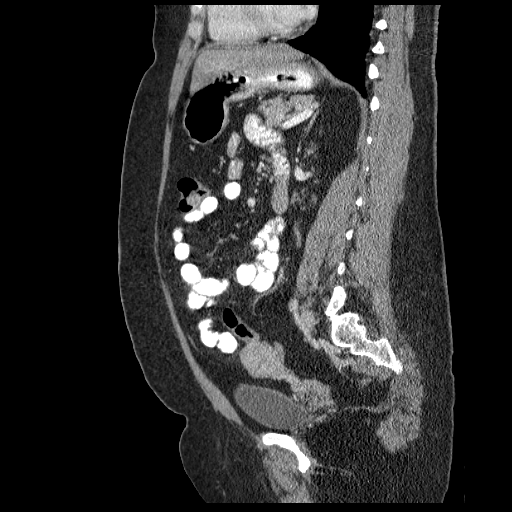
[im 101/159  soft-tissue]
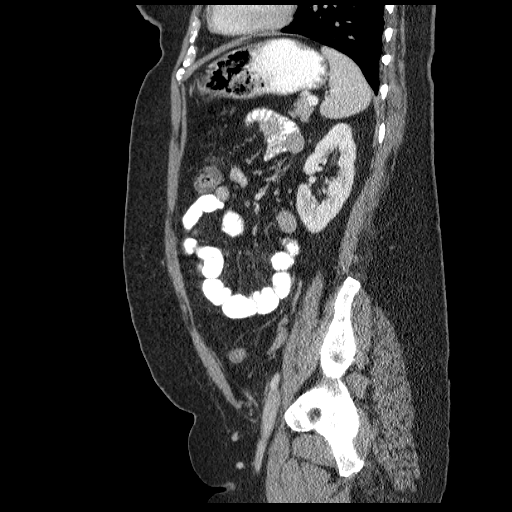
[im 130/159  soft-tissue]
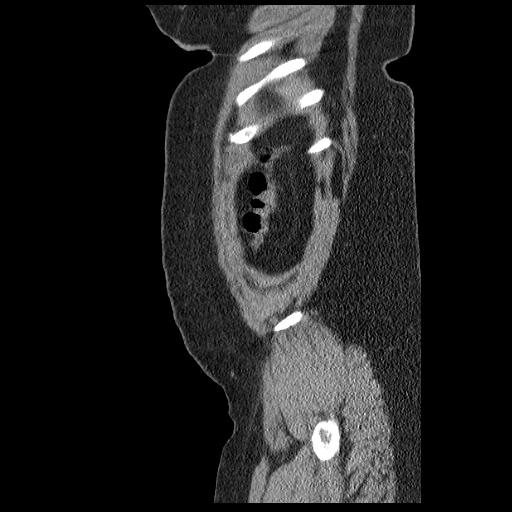
[im 144/159  soft-tissue]
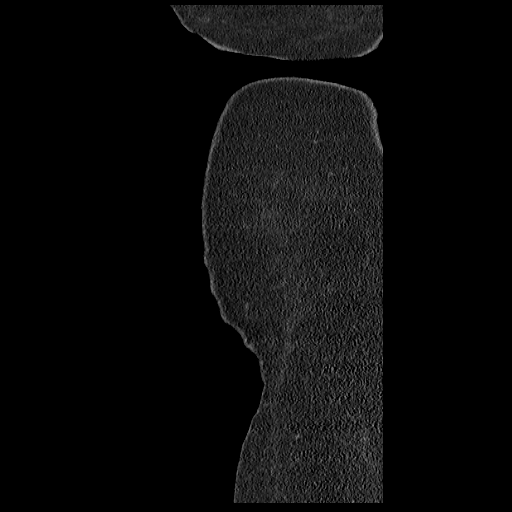

[14 of 32 positions shown; findings below may reference images not displayed]

FINDINGS: Lung bases:  Clear.  Heart normal size.

Liver: Probable fatty infiltration. 6 mm low-density lesion in the
medial segment left lobe, most likely a cyst. No other abnormality.

Spleen, gallbladder, pancreas, adrenal glands:  Unremarkable.

Kidneys, ureters, bladder: 1 cm low-density lesion in the midpole
the left kidney, likely a cyst. No other renal abnormality. No
stones. No hydronephrosis. Normal ureters. Normal bladder.

Uterus and adnexa:  Unremarkable.

Lymph nodes:  No adenopathy.

Ascites:  None.

Gastrointestinal: Normal appendix visualized. Stomach, colon and
small bowel are unremarkable.

Musculoskeletal:  Normal.
IMPRESSION: 1. No acute findings.  Normal appendix visualized.
2. Probable fatty infiltration of the liver.
3. Small low-density liver and left renal lesions both likely cysts.
4. No other abnormalities.

## 2015-12-15 ENCOUNTER — Other Ambulatory Visit: Payer: Self-pay | Admitting: Family Medicine

## 2015-12-15 DIAGNOSIS — K219 Gastro-esophageal reflux disease without esophagitis: Secondary | ICD-10-CM

## 2015-12-23 ENCOUNTER — Other Ambulatory Visit: Payer: Self-pay | Admitting: Family Medicine

## 2015-12-23 DIAGNOSIS — Z1239 Encounter for other screening for malignant neoplasm of breast: Secondary | ICD-10-CM

## 2015-12-24 ENCOUNTER — Telehealth: Payer: Self-pay | Admitting: Family Medicine

## 2015-12-24 DIAGNOSIS — K219 Gastro-esophageal reflux disease without esophagitis: Secondary | ICD-10-CM

## 2015-12-24 DIAGNOSIS — F329 Major depressive disorder, single episode, unspecified: Secondary | ICD-10-CM

## 2015-12-24 DIAGNOSIS — F32A Depression, unspecified: Secondary | ICD-10-CM

## 2015-12-24 NOTE — Telephone Encounter (Signed)
Called pt to reschedule her med FU on 12/25/15. She asked if it would be possible to call her in enough medication to get her through to the first of January so that she can just schedule her CPE and med FU at the same time. Her CPE appointment is scheduled for 01/19/16. Please advise. Thanks!

## 2015-12-25 ENCOUNTER — Ambulatory Visit: Payer: BLUE CROSS/BLUE SHIELD | Admitting: Family Medicine

## 2015-12-25 MED ORDER — SERTRALINE HCL 100 MG PO TABS
100.0000 mg | ORAL_TABLET | Freq: Every day | ORAL | 1 refills | Status: DC
Start: 2015-12-25 — End: 2016-01-19

## 2015-12-25 MED ORDER — ESOMEPRAZOLE MAGNESIUM 20 MG PO CPDR: 20.0000 mg | DELAYED_RELEASE_CAPSULE | Freq: Every day | ORAL | 1 refills | Status: DC | PRN

## 2015-12-25 NOTE — Telephone Encounter (Signed)
rx for nexium and sertraline sent to [facility].Naif Alabi, Viann Shoveonya [name]

## 2016-01-02 ENCOUNTER — Ambulatory Visit: Payer: BLUE CROSS/BLUE SHIELD

## 2016-01-15 ENCOUNTER — Other Ambulatory Visit: Payer: Self-pay | Admitting: Family Medicine

## 2016-01-19 ENCOUNTER — Ambulatory Visit (INDEPENDENT_AMBULATORY_CARE_PROVIDER_SITE_OTHER): Payer: BLUE CROSS/BLUE SHIELD | Admitting: Family Medicine

## 2016-01-19 ENCOUNTER — Encounter: Payer: Self-pay | Admitting: Family Medicine

## 2016-01-19 VITALS — BP 118/65 | HR 68 | Ht 62.0 in | Wt 197.0 lb

## 2016-01-19 DIAGNOSIS — Z23 Encounter for immunization: Secondary | ICD-10-CM | POA: Diagnosis not present

## 2016-01-19 DIAGNOSIS — Z0000 Encounter for general adult medical examination without abnormal findings: Secondary | ICD-10-CM | POA: Diagnosis not present

## 2016-01-19 DIAGNOSIS — Z01419 Encounter for gynecological examination (general) (routine) without abnormal findings: Secondary | ICD-10-CM

## 2016-01-19 DIAGNOSIS — F32A Depression, unspecified: Secondary | ICD-10-CM

## 2016-01-19 DIAGNOSIS — F329 Major depressive disorder, single episode, unspecified: Secondary | ICD-10-CM | POA: Diagnosis not present

## 2016-01-19 MED ORDER — BECLOMETHASONE DIPROPIONATE 80 MCG/ACT NA AERS: 1.0000 | INHALATION_SPRAY | Freq: Every day | NASAL | 11 refills | Status: DC

## 2016-01-19 MED ORDER — SERTRALINE HCL 100 MG PO TABS: 100.0000 mg | ORAL_TABLET | Freq: Every day | ORAL | 1 refills | Status: DC

## 2016-01-19 NOTE — Progress Notes (Signed)
Subjective:     Jessica Cunningham is a 41 y.o. female and is here for a comprehensive physical exam. The patient reports no problems.  Social History   Social History  . Marital status: Married    Spouse name: N/A  . Number of children: 2   . Years of education: N/A   Occupational History  . Not on file.   Social History Main Topics  . Smoking status: Former Smoker    Quit date: 01/11/2001  . Smokeless tobacco: Not on file  . Alcohol use 0.0 - 0.6 oz/week  . Drug use: No  . Sexual activity: Yes    Partners: Male    Birth control/ protection: Other-see comments     Comment: Husband vasectomy   Other Topics Concern  . Not on file   Social History Narrative   No regular exercise. Daily caffeine.    Health Maintenance  Topic Date Due  . INFLUENZA VACCINE  08/12/2015  . TETANUS/TDAP  01/12/2016  . PAP SMEAR  12/16/2017  . HIV Screening  Completed    The following portions of the patient's history were reviewed and updated as appropriate: allergies, current medications, past family history, past medical history, past social history, past surgical history and problem list.  Review of Systems A comprehensive review of systems was negative.   Objective:    BP 118/65   Pulse 68   Ht 5\' 2"  (1.575 m)   Wt 197 lb (89.4 kg)   SpO2 98%   BMI 36.03 kg/m  General appearance: alert, cooperative and appears stated age Head: Normocephalic, without obvious abnormality, atraumatic Eyes: conj clear, EOMI, PEERLA Ears: normal TM's and external ear canals both ears Nose: Nares normal. Septum midline. Mucosa normal. No drainage or sinus tenderness. Throat: lips, mucosa, and tongue normal; teeth and gums normal Neck: no adenopathy, no carotid bruit, no JVD, supple, symmetrical, trachea midline and thyroid not enlarged, symmetric, no tenderness/mass/nodules Back: symmetric, no curvature. ROM normal. No CVA tenderness. Lungs: clear to auscultation bilaterally Breasts: normal appearance,  no masses or tenderness Heart: regular rate and rhythm, S1, S2 normal, no murmur, click, rub or gallop Abdomen: soft, non-tender; bowel sounds normal; no masses,  no organomegaly Extremities: extremities normal, atraumatic, no cyanosis or edema Pulses: 2+ and symmetric Skin: Skin color, texture, turgor normal. No rashes or lesions Lymph nodes: Cervical, supraclavicular, and axillary nodes normal. Neurologic: Alert and oriented X 3, normal strength and tone. Normal symmetric reflexes. Normal coordination and gait    Assessment:    Healthy female exam.      Plan:     See After Visit Summary for Counseling Recommendations   Keep up a regular exercise program and make sure you are eating a healthy diet Try to eat 4 servings of dairy a day, or if you are lactose intolerant take a calcium with vitamin D daily.  Your vaccines are up to date.  Did encourage her to reschedule her mammogram.   Tdap given today as well as flu vaccine  Depression - Doing well on sertraline. Continue current regimen. Follow-up in 6 months.

## 2016-01-19 NOTE — Patient Instructions (Signed)
Keep up a regular exercise program and make sure you are eating a healthy diet Try to eat 4 servings of dairy a day, or if you are lactose intolerant take a calcium with vitamin D daily.  Your vaccines are up to date.   

## 2016-02-06 ENCOUNTER — Ambulatory Visit (INDEPENDENT_AMBULATORY_CARE_PROVIDER_SITE_OTHER): Payer: BLUE CROSS/BLUE SHIELD

## 2016-02-06 DIAGNOSIS — Z1231 Encounter for screening mammogram for malignant neoplasm of breast: Secondary | ICD-10-CM

## 2016-02-06 DIAGNOSIS — Z1239 Encounter for other screening for malignant neoplasm of breast: Secondary | ICD-10-CM

## 2016-02-06 DIAGNOSIS — Z01419 Encounter for gynecological examination (general) (routine) without abnormal findings: Secondary | ICD-10-CM | POA: Diagnosis not present

## 2016-02-07 LAB — LIPID PANEL
Cholesterol: 165 mg/dL (ref <200)
HDL: 26 mg/dL — ABNORMAL LOW (ref >50)
LDL CALC: 121 mg/dL — AB (ref <100)
Total CHOL/HDL Ratio: 6.3 Ratio — ABNORMAL HIGH (ref <5.0)
Triglycerides: 92 mg/dL (ref <150)
VLDL: 18 mg/dL (ref <30)

## 2016-02-07 LAB — COMPLETE METABOLIC PANEL WITH GFR
ALBUMIN: 4.4 g/dL (ref 3.6–5.1)
ALT: 16 U/L (ref 6–29)
AST: 13 U/L (ref 10–30)
Alkaline Phosphatase: 63 U/L (ref 33–115)
BILIRUBIN TOTAL: 0.5 mg/dL (ref 0.2–1.2)
BUN: 14 mg/dL (ref 7–25)
CO2: 30 mmol/L (ref 20–31)
CREATININE: 0.64 mg/dL (ref 0.50–1.10)
Calcium: 9.2 mg/dL (ref 8.6–10.2)
Chloride: 105 mmol/L (ref 98–110)
GFR, Est Non African American: >89 mL/min (ref >=60)
GLUCOSE: 92 mg/dL (ref 65–99)
Potassium: 4.3 mmol/L (ref 3.5–5.3)
SODIUM: 140 mmol/L (ref 135–146)
TOTAL PROTEIN: 7.1 g/dL (ref 6.1–8.1)

## 2016-02-07 LAB — HEMOGLOBIN A1C
HEMOGLOBIN A1C: 5.1 % (ref <5.7)
Mean Plasma Glucose: 100 mg/dL

## 2016-02-10 ENCOUNTER — Ambulatory Visit (INDEPENDENT_AMBULATORY_CARE_PROVIDER_SITE_OTHER): Payer: BLUE CROSS/BLUE SHIELD | Admitting: Physician Assistant

## 2016-02-10 VITALS — BP 108/65 | HR 82 | Temp 98.2°F | Wt 194.0 lb

## 2016-02-10 DIAGNOSIS — J019 Acute sinusitis, unspecified: Secondary | ICD-10-CM

## 2016-02-10 MED ORDER — IPRATROPIUM BROMIDE 0.06 % NA SOLN: 1.0000 | Freq: Four times a day (QID) | NASAL | 0 refills | Status: DC | PRN

## 2016-02-10 NOTE — Patient Instructions (Addendum)
SUDAFED PE (PHENYLEPHRINE) - 10 mg every 4 - 6 hours, maximum 60 mg per day TYLENOL (ACETAMINOPHEN) - 500mg  every 4-6 hours for headache, maximum of 3g per day ATROVENT - nasal spray - up to 4 times daily  To use nasal spray while you are sick: - tilt head back - aim away from nasal septum - dispense medicine - do not sniff or inhale - allow medicine to bathe the tissues    Sinusitis, Adult Sinusitis is soreness and inflammation of your sinuses. Sinuses are hollow spaces in the bones around your face. Your sinuses are located:  Around your eyes.  In the middle of your forehead.  Behind your nose.  In your cheekbones. Your sinuses and nasal passages are lined with a stringy fluid (mucus). Mucus normally drains out of your sinuses. When your nasal tissues become inflamed or swollen, the mucus can become trapped or blocked so air cannot flow through your sinuses. This allows bacteria, viruses, and funguses to grow, which leads to infection. Sinusitis can develop quickly and last for 7?10 days (acute) or for more than 12 weeks (chronic). Sinusitis often develops after a cold. What are the causes? This condition is caused by anything that creates swelling in the sinuses or stops mucus from draining, including:  Allergies.  Asthma.  Bacterial or viral infection.  Abnormally shaped bones between the nasal passages.  Nasal growths that contain mucus (nasal polyps).  Narrow sinus openings.  Pollutants, such as chemicals or irritants in the air.  A foreign object stuck in the nose.  A fungal infection. This is rare. What increases the risk? The following factors may make you more likely to develop this condition:  Having allergies or asthma.  Having had a recent cold or respiratory tract infection.  Having structural deformities or blockages in your nose or sinuses.  Having a weak immune system.  Doing a lot of swimming or diving.  Overusing nasal  sprays.  Smoking. What are the signs or symptoms? The main symptoms of this condition are pain and a feeling of pressure around the affected sinuses. Other symptoms include:  Upper toothache.  Earache.  Headache.  Bad breath.  Decreased sense of smell and taste.  A cough that may get worse at night.  Fatigue.  Fever.  Thick drainage from your nose. The drainage is often green and it may contain pus (purulent).  Stuffy nose or congestion.  Postnasal drip. This is when extra mucus collects in the throat or back of the nose.  Swelling and warmth over the affected sinuses.  Sore throat.  Sensitivity to light. How is this diagnosed? This condition is diagnosed based on symptoms, a medical history, and a physical exam. To find out if your condition is acute or chronic, your health care provider may:  Look in your nose for signs of nasal polyps.  Tap over the affected sinus to check for signs of infection.  View the inside of your sinuses using an imaging device that has a light attached (endoscope). If your health care provider suspects that you have chronic sinusitis, you may also:  Be tested for allergies.  Have a sample of mucus taken from your nose (nasal culture) and checked for bacteria.  Have a mucus sample examined to see if your sinusitis is related to an allergy. If your sinusitis does not respond to treatment and it lasts longer than 8 weeks, you may have an MRI or CT scan to check your sinuses. These scans also help  to determine how severe your infection is. In rare cases, a bone biopsy may be done to rule out more serious types of fungal sinus disease. How is this treated? Treatment for sinusitis depends on the cause and whether your condition is chronic or acute. If a virus is causing your sinusitis, your symptoms will go away on their own within 10 days. You may be given medicines to relieve your symptoms, including:  Topical nasal decongestants. They  shrink swollen nasal passages and let mucus drain from your sinuses.  Antihistamines. These drugs block inflammation that is triggered by allergies. This can help to ease swelling in your nose and sinuses.  Topical nasal corticosteroids. These are nasal sprays that ease inflammation and swelling in your nose and sinuses.  Nasal saline washes. These rinses can help to get rid of thick mucus in your nose. If your condition is caused by bacteria, you will be given an antibiotic medicine. If your condition is caused by a fungus, you will be given an antifungal medicine. Surgery may be needed to correct underlying conditions, such as narrow nasal passages. Surgery may also be needed to remove polyps. Follow these instructions at home: Medicines  Take, use, or apply over-the-counter and prescription medicines only as told by your health care provider. These may include nasal sprays.  If you were prescribed an antibiotic medicine, take it as told by your health care provider. Do not stop taking the antibiotic even if you start to feel better. Hydrate and Humidify  Drink enough water to keep your urine clear or pale yellow. Staying hydrated will help to thin your mucus.  Use a cool mist humidifier to keep the humidity level in your home above 50%.  Inhale steam for 10-15 minutes, 3-4 times a day or as told by your health care provider. You can do this in the bathroom while a hot shower is running.  Limit your exposure to cool or dry air. Rest  Rest as much as possible.  Sleep with your head raised (elevated).  Make sure to get enough sleep each night. General instructions  Apply a warm, moist washcloth to your face 3-4 times a day or as told by your health care provider. This will help with discomfort.  Wash your hands often with soap and water to reduce your exposure to viruses and other germs. If soap and water are not available, use hand sanitizer.  Do not smoke. Avoid being around  people who are smoking (secondhand smoke).  Keep all follow-up visits as told by your health care provider. This is important. Contact a health care provider if:  You have a fever.  Your symptoms get worse.  Your symptoms do not improve within 10 days. Get help right away if:  You have a severe headache.  You have persistent vomiting.  You have pain or swelling around your face or eyes.  You have vision problems.  You develop confusion.  Your neck is stiff.  You have trouble breathing. This information is not intended to replace advice given to you by your health care provider. Make sure you discuss any questions you have with your health care provider. Document Released: 12/28/2004 Document Revised: 08/24/2015 Document Reviewed: 10/23/2014 Elsevier Interactive Patient Education  2017 [employer].

## 2016-02-10 NOTE — Progress Notes (Signed)
HPI:                                                                Jessica Cunningham is a 41 y.o. female who [facility] today for a sick visit  Patient with PMH of chronic allergic rhinitis presents with Sinus Problem  This is a new problem. The current episode started in the past 7 days (2 days ago). The problem has been gradually worsening since onset. There has been no fever. Associated symptoms include chills, congestion, headaches and a sore throat. Pertinent negatives include no coughing, ear pain, neck pain or shortness of breath. Treatments tried: Mucinex. The treatment provided no relief.    Past Medical History:  Diagnosis Date  . Allergy   . Depression   . Hyperlipidemia    Past Surgical History:  Procedure Laterality Date  . CESAREAN SECTION     x 2   . TONSILLECTOMY    . WISDOM TOOTH EXTRACTION     Social History  Substance Use Topics  . Smoking status: Former Smoker    Quit date: 01/11/2001  . Smokeless tobacco: Not on file  . Alcohol use 0.0 - 0.6 oz/week   family history includes Hyperlipidemia in her father and mother.  Review of Systems  Constitutional: Positive for chills and malaise/fatigue. Negative for fever.  HENT: Positive for congestion and sore throat. Negative for ear pain.   Respiratory: Negative for cough and shortness of breath.   Cardiovascular: Negative for chest pain.  Gastrointestinal: Negative for abdominal pain, nausea and vomiting.  Musculoskeletal: Negative for neck pain.  Skin: Negative for rash.  Neurological: Positive for headaches.  Endo/Heme/Allergies: Positive for environmental allergies.   Medications: Current Outpatient Prescriptions  Medication Sig Dispense Refill  . Beclomethasone Dipropionate (QNASL) 80 MCG/ACT AERS Place 1 spray into both nostrils daily. 1 Inhaler 11  . esomeprazole (NEXIUM) 20 MG capsule Take 1 capsule (20 mg total) by mouth daily as needed. 30  capsule 1  . fexofenadine (ALLEGRA) 180 MG tablet Take 180 mg by mouth daily.    . montelukast (SINGULAIR) 10 MG tablet TAKE 1 TABLET (10 MG TOTAL) BY MOUTH AT BEDTIME. 30 tablet 2  . rOPINIRole (REQUIP) 0.25 MG tablet TAKE 1-2 TABLETS (0.25-0.5 MG TOTAL) BY MOUTH AT BEDTIME. 180 tablet 1  . sertraline (ZOLOFT) 100 MG tablet Take 1 tablet (100 mg total) by mouth daily. 90 tablet 1  . triamcinolone cream (KENALOG) 0.1 % Apply 1 application topically daily as needed. 45 g 1   No current facility-administered medications for this visit.    Allergies  Allergen Reactions  . Paxil [Paroxetine Hcl] Other (See Comments)    Made her "crazy", hallucination  . Topamax [Topiramate]     Forgetful/not been able to complete sentences.   . Wellbutrin [Bupropion] Other (See Comments)    Hand shakes       Objective:  BP 108/65   Pulse 82   Temp 98.2 F (36.8 C) (Oral)   Wt 194 lb (88 kg)   BMI 35.48 kg/m  Gen: well-groomed, cooperative, ill-appearing, not toxic-appearing, no distress HEENT: normal conjunctiva, TM's clear, nasal mucosa edematous with rhinorrhea, oropharynx clear, geographic tongue, moist mucus membranes, no frontal or maxillary sinus tenderness  Pulm: Normal work of breathing, normal phonation, clear to auscultation bilaterally, no wheezes, rales or rhonchi CV: Normal rate, regular rhythm, s1 and s2 distinct, no murmurs, clicks or rubs  GI: soft, nondistended, nontender Neuro: alert and oriented x 3, EOM's intact Lymph: no cervical or tonsillar adenopathy Skin: warm and dry, no rashes or lesions on exposed skin, no cyanosis   No results found for this or any previous visit (from the past 72 hour(s)). No results found.    Assessment and Plan: 41 y.o. female with   1. Acute non-recurrent sinusitis, unspecified location - symptomatic management with oral decongestant, Tylenol, and atrovent nasal spray - ipratropium (ATROVENT) 0.06 % nasal spray; Place 1 spray into both  nostrils 4 (four) times daily as needed.  Dispense: 15 mL; Refill: 0   Patient education and anticipatory guidance given Patient agrees with treatment plan Follow-up as needed if symptoms worsen or fail to improve  Levonne Hubert PA-C

## 2016-05-08 ENCOUNTER — Encounter: Payer: Self-pay | Admitting: Emergency Medicine

## 2016-05-08 ENCOUNTER — Emergency Department
Admission: EM | Admit: 2016-05-08 | Discharge: 2016-05-08 | Disposition: A | Discharge status: Home / Self Care | Payer: BLUE CROSS/BLUE SHIELD | Source: Home / Self Care | Attending: Family Medicine | Admitting: Family Medicine

## 2016-05-08 DIAGNOSIS — H6691 Otitis media, unspecified, right ear: Secondary | ICD-10-CM

## 2016-05-08 MED ORDER — AMOXICILLIN-POT CLAVULANATE 875-125 MG PO TABS: 1.0000 | ORAL_TABLET | Freq: Two times a day (BID) | ORAL | 0 refills | Status: DC

## 2016-05-08 NOTE — ED Provider Notes (Signed)
CSN: [id number]     Arrival date & time 05/08/16  1424 History   First MD Initiated Contact with Patient 05/08/16 1443     Chief Complaint  Patient presents with  . Otalgia   (Consider location/radiation/quality/duration/timing/severity/associated sxs/prior Treatment) HPI  Jessica Cunningham is a 41 y.o. female presenting to UC with c/o Right ear pain that started today but started about 1 week ago with a "fullness" sensation in Right ear. Mild congestion. Denies sore throat, fever, chills, n/v/d. No sick contacts. Ibuprofen with mild relief. Hx of ear infections in the past.    Past Medical History:  Diagnosis Date  . Allergy   . Depression   . Hyperlipidemia    Past Surgical History:  Procedure Laterality Date  . CESAREAN SECTION     x 2   . TONSILLECTOMY    . WISDOM TOOTH EXTRACTION     Family History  Problem Relation Age of Onset  . Hyperlipidemia Mother   . Hyperlipidemia Father    Social History  Substance Use Topics  . Smoking status: Former Smoker    Quit date: 01/11/2001  . Smokeless tobacco: Never Used  . Alcohol use 0.0 - 0.6 oz/week   OB History    No data available     Review of Systems  Constitutional: Negative for chills and fever.  HENT: Positive for congestion and ear pain (Right). Negative for sinus pain, sinus pressure and sore throat.   Respiratory: Positive for cough (minimal). Negative for chest tightness and shortness of breath.   Gastrointestinal: Negative for diarrhea, nausea and vomiting.    Allergies  Paxil [paroxetine hcl]; Topamax [topiramate]; and Wellbutrin [bupropion]  Home Medications   Prior to Admission medications   Medication Sig Start Date End Date Taking? Authorizing Provider  amoxicillin-clavulanate (AUGMENTIN) 875-125 MG tablet Take 1 tablet by mouth 2 (two) times daily. One po bid x 7 days 05/08/16   Junius Finner, PA-C  Beclomethasone Dipropionate (QNASL) 80 MCG/ACT AERS Place 1 spray into both nostrils daily. 01/19/16    Agapito Games, MD  esomeprazole (NEXIUM) 20 MG capsule Take 1 capsule (20 mg total) by mouth daily as needed. 12/25/15   Agapito Games, MD  fexofenadine (ALLEGRA) 180 MG tablet Take 180 mg by mouth daily.    Historical Provider, MD  ipratropium (ATROVENT) 0.06 % nasal spray Place 1 spray into both nostrils 4 (four) times daily as needed. 02/10/16   Carlis Stable, PA-C  montelukast (SINGULAIR) 10 MG tablet TAKE 1 TABLET (10 MG TOTAL) BY MOUTH AT BEDTIME. 04/14/15   Agapito Games, MD  rOPINIRole (REQUIP) 0.25 MG tablet TAKE 1-2 TABLETS (0.25-0.5 MG TOTAL) BY MOUTH AT BEDTIME. 01/16/16   Agapito Games, MD  sertraline (ZOLOFT) 100 MG tablet Take 1 tablet (100 mg total) by mouth daily. 01/19/16   Agapito Games, MD  triamcinolone cream (KENALOG) 0.1 % Apply 1 application topically daily as needed. 04/21/15   Agapito Games, MD   Meds Ordered and Administered this Visit  Medications - No data to display  BP 112/74 (BP Location: Left Arm)   Pulse 84   Temp 97.8 F (36.6 C) (Oral)   Resp 16   Ht 5' 2.75" (1.594 m)   Wt 195 lb (88.5 kg)   LMP 05/01/2016 (Approximate)   SpO2 96%   BMI 34.82 kg/m  No data found.   Physical Exam  Constitutional: She is oriented to person, place, and time. She appears well-developed and well-nourished. No distress.  HENT:  Head: Normocephalic and atraumatic.  Right Ear: Tympanic membrane is erythematous. Tympanic membrane is not bulging.  Left Ear: Tympanic membrane normal.  Nose: Nose normal.  Mouth/Throat: Uvula is midline, oropharynx is clear and moist and mucous membranes are normal.  Eyes: EOM are normal.  Neck: Normal range of motion. Neck supple.  Cardiovascular: Normal rate and regular rhythm.   Pulmonary/Chest: Effort normal and breath sounds normal. No stridor. No respiratory distress. She has no wheezes. She has no rales.  Musculoskeletal: Normal range of motion.  Lymphadenopathy:    She has no  cervical adenopathy.  Neurological: She is alert and oriented to person, place, and time.  Skin: Skin is warm and dry. She is not diaphoretic.  Psychiatric: She has a normal mood and affect. Her behavior is normal.  Nursing note and vitals reviewed.   Urgent Care Course     Procedures (including critical care time)  Labs Review Labs Reviewed - No data to display  Imaging Review No results found.    MDM   1. Right acute otitis media    Right TM- erythematous Will cover for bacterial cause. Rx: Augmentin f/u with PCP in 1 week if not improving.     Junius Finner, PA-C 05/08/16 [phone]

## 2016-05-08 NOTE — ED Triage Notes (Signed)
Reports pain in right ear starting today. Does not want any pain medication.

## 2016-05-10 ENCOUNTER — Encounter: Payer: Self-pay | Admitting: Family Medicine

## 2016-07-11 ENCOUNTER — Other Ambulatory Visit: Payer: Self-pay | Admitting: Family Medicine

## 2016-07-11 DIAGNOSIS — F32A Depression, unspecified: Secondary | ICD-10-CM

## 2016-07-11 DIAGNOSIS — F329 Major depressive disorder, single episode, unspecified: Secondary | ICD-10-CM

## 2016-08-17 ENCOUNTER — Ambulatory Visit (INDEPENDENT_AMBULATORY_CARE_PROVIDER_SITE_OTHER): Payer: BLUE CROSS/BLUE SHIELD

## 2016-08-17 ENCOUNTER — Ambulatory Visit (INDEPENDENT_AMBULATORY_CARE_PROVIDER_SITE_OTHER): Payer: BLUE CROSS/BLUE SHIELD | Admitting: Family Medicine

## 2016-08-17 ENCOUNTER — Encounter: Payer: Self-pay | Admitting: Family Medicine

## 2016-08-17 DIAGNOSIS — G8929 Other chronic pain: Secondary | ICD-10-CM

## 2016-08-17 DIAGNOSIS — M7731 Calcaneal spur, right foot: Secondary | ICD-10-CM

## 2016-08-17 DIAGNOSIS — M25571 Pain in right ankle and joints of right foot: Secondary | ICD-10-CM

## 2016-08-17 DIAGNOSIS — M779 Enthesopathy, unspecified: Secondary | ICD-10-CM | POA: Diagnosis not present

## 2016-08-17 MED ORDER — DICLOFENAC SODIUM 1 % TD GEL: 2.0000 g | Freq: Four times a day (QID) | TRANSDERMAL | 11 refills | Status: AC

## 2016-08-17 NOTE — Patient Instructions (Addendum)
Thank you for coming in today. Attend PT.  Use voltaren gel for pain as needed.  Use the compression sleeve from [employer].   It is a medium full ankle.    Get xray today.   Recheck with me in 6 weeks.  Return sooner if needed.   Peroneal Tendinopathy Peroneal tendinopathy is irritation of the tendons that pass behind your ankle (peroneal tendons). These tendons attach muscles in your foot to a bone on the side of your foot and underneath the arch of your foot. This condition can cause your peroneal tendons to get bigger and swell. What are the causes? This condition may be caused by:  Putting stress on your ankle over and over again (overuse injury).  A sudden injury that puts stress on your tendons, such as an ankle sprain.  What increases the risk? This condition is more likely to develop in:  People who have high arches.  Athletes who play sports that involve putting stress on the ankle over and over again. These sports include: ? Running. ? Dancing. ? Soccer. ? Basketball.  What are the signs or symptoms? Symptoms of this condition can start suddenly or develop gradually. Symptoms include:  Pain in the back of the ankle, on the side of the foot, or in the arch of the foot.  Pain that gets worse with activity and better with rest.  Swelling.  Warmth.  Weakness in your foot or ankle.  How is this diagnosed? This condition may be diagnosed based on:  Your symptoms.  Your medical history.  A physical exam.  Imaging tests, such as: ? An X-ray or CT scan to check for bone injury. ? MRI or ultrasound to check for muscle or tendon injury.  During your physical exam, your health care provider may move your foot and ankle and test the strength of your leg muscles. How is this treated? This condition may be treated by:  Keeping your body weight off your ankle for several days.  Returning gradually to full activity gradually.  Putting ice on your ankle to  reduce swelling.  Taking an anti-inflammatory pain medicine (NSAID).  Having medicine injected into your tendon to reduce swelling.  Wearing a removable boot or brace for ankle support.  Doing range-of-motion exercises and strengthening exercises (physical therapy) when pain and swelling improve.  If the condition does not improve with treatment, or if a tendon or muscle is damaged, surgery may be needed. Follow these instructions at home: If you have a boot or brace:  Wear it as told by your health care provider. Remove it only as told by your health care provider.  Loosen it if your toes tingle, become numb, or turn cold and blue.  Do not let it get wet if it is not waterproof.  Keep it clean. Managing pain, stiffness, and swelling  If directed, apply ice to the injured area: ? Put ice in a plastic bag. ? Place a towel between your skin and the bag. ? Leave the ice on for 20 minutes, 2-3 times a day.  Take over-the-counter and prescription medicines only as told by your health care provider.  Raise (elevate) your ankle above the level of your heart when resting if you have swelling. Activity  Do not use your ankle to support (bear) your full body weight until your health care provider says that you can.  Do not do activities that make pain or swelling worse.  Return to your normal activities as told  by your health care provider. General instructions  Keep all follow-up visits as told by your health care provider. This is important. How is this prevented?  Wear supportive footwear that is appropriate for your athletic activity.  Avoid athletic activities that cause swelling or pain in your ankle or foot.  See your health care provider if you have pain or swelling that does not improve after a few days of rest.  Stop training if you develop pain or swelling.  If you start a new athletic activity, start gradually to build up your strength, endurance, and  flexibility. Contact a health care provider if:  Your symptoms get worse.  Your symptoms do not improve in 2-4 weeks.  You develop new, unexplained symptoms. This information is not intended to replace advice given to you by your health care provider. Make sure you discuss any questions you have with your health care provider. Document Released: 12/28/2004 Document Revised: 09/02/2015 Document Reviewed: 11/16/2014 Elsevier Interactive Patient Education  [employer]2018 Elsevier Inc.

## 2016-08-17 NOTE — Progress Notes (Signed)
Jessica Cunningham is a 41 y.o. female who presents to [facility] today for right ankle pain. Patient notes ongoing right ankle pain and dysfunction. She's had multiple ankle sprains with pain off and on for months to years. However her symptoms have worsened over the past 3 weeks. She notes she increased her walking recently while on vacation which caused her pain to worsen. She notes pain is worse in the lateral aspect of the ankle both anterior to posterior to the lateral malleolus. Symptoms are worse with foot plantar flexion with gait. She denies any radiating pain weakness or numbness fevers or chills. She denies any injuries. She has tried some over-the-counter medicines for pain as well as over-the-counter ankle brace which have not been very helpful.   Past Medical History:  Diagnosis Date  . Allergy   . Depression   . Hyperlipidemia    Past Surgical History:  Procedure Laterality Date  . CESAREAN SECTION     x 2   . TONSILLECTOMY    . WISDOM TOOTH EXTRACTION     Social History  Substance Use Topics  . Smoking status: Former Smoker    Quit date: 01/11/2001  . Smokeless tobacco: Never Used  . Alcohol use 0.0 - 0.6 oz/week     ROS:  As above   Medications: Current Outpatient Prescriptions  Medication Sig Dispense Refill  . Beclomethasone Dipropionate (QNASL) 80 MCG/ACT AERS Place 1 spray into both nostrils daily. 1 Inhaler 11  . esomeprazole (NEXIUM) 20 MG capsule Take 1 capsule (20 mg total) by mouth daily as needed. 30 capsule 1  . fexofenadine (ALLEGRA) 180 MG tablet Take 180 mg by mouth daily.    Marland Kitchen. ipratropium (ATROVENT) 0.06 % nasal spray Place 1 spray into both nostrils 4 (four) times daily as needed. 15 mL 0  . montelukast (SINGULAIR) 10 MG tablet TAKE 1 TABLET (10 MG TOTAL) BY MOUTH AT BEDTIME. 30 tablet 2  . rOPINIRole (REQUIP) 0.25 MG tablet TAKE 1-2 TABLETS (0.25-0.5 MG TOTAL) BY MOUTH AT BEDTIME. 180 tablet 1  .  sertraline (ZOLOFT) 100 MG tablet Take 1 tablet (100 mg total) by mouth daily. Please schedule a follow up visit for next refill. 90 tablet 0  . triamcinolone cream (KENALOG) 0.1 % Apply 1 application topically daily as needed. 45 g 1  . diclofenac sodium (VOLTAREN) 1 % GEL Apply 2 g topically 4 (four) times daily. To affected joint. 100 g 11   No current facility-administered medications for this visit.    Allergies  Allergen Reactions  . Paxil [Paroxetine Hcl] Other (See Comments)    Made her "crazy", hallucination  . Topamax [Topiramate]     Forgetful/not been able to complete sentences.   . Wellbutrin [Bupropion] Other (See Comments)    Hand shakes     Exam:  BP 121/77   Pulse 80   Ht 5\' 2"  (1.575 m)   Wt 206 lb (93.4 kg)   BMI 37.68 kg/m  General: Well Developed, well nourished, and in no acute distress.  Neuro/Psych: Alert and oriented x3, extra-ocular muscles intact, able to move all 4 extremities, sensation grossly intact. Skin: Warm and dry, no rashes noted.  Respiratory: Not using accessory muscles, speaking in full sentences, trachea midline.  Cardiovascular: Pulses palpable, no extremity edema. Abdomen: Does not appear distended. MSK:  Right ankle normal-appearing with no significant swelling. Tender palpation along the posterior aspect of the lateral malleolus along the course of the peroneal tendons. Mildly  tender at the ATFL as well. Stable ligamentous exam with normal motion. Mild pain with resisted foot eversion. Pulses capillary refill and sensation are intact.  X-ray right ankle pending    No results found for this or any previous visit (from the past 48 hour(s)). No results found.    Assessment and Plan: 41 y.o. female with right ankle pain likely due to peroneal tendinopathy. X-ray pending. Plan for empiric treatment with compressive ankle sleeve, diclofenac gel and physical therapy. Recheck in 6 weeks if not better will likely proceed with more  invasive treatment and testing. Return sooner if needed.    Orders Placed This Encounter  Procedures  . DG Ankle Complete Right    Standing Status:   Future    Number of Occurrences:   1    Standing Expiration Date:   10/17/2017    Order Specific Question:   Reason for Exam (SYMPTOM  OR DIAGNOSIS REQUIRED)    Answer:   eval lateral ankle pain. Chronic. Likely PeroneAL Tendonitis    Order Specific Question:   Is patient pregnant?    Answer:   No    Order Specific Question:   Preferred imaging location?    Answer:   [facility]    Order Specific Question:   Radiology Contrast Protocol - do NOT remove file path    Answer:   [email or web address]  . Ambulatory referral to Physical Therapy    Referral Priority:   Routine    Referral Type:   Physical Medicine    Referral Reason:   Specialty Services Required    Requested Specialty:   Physical Therapy   Meds ordered this encounter  Medications  . diclofenac sodium (VOLTAREN) 1 % GEL    Sig: Apply 2 g topically 4 (four) times daily. To affected joint.    Dispense:  100 g    Refill:  11    Discussed warning signs or symptoms. Please see discharge instructions. Patient expresses understanding.

## 2016-09-03 ENCOUNTER — Ambulatory Visit: Payer: Self-pay | Admitting: Physical Therapy

## 2016-09-20 ENCOUNTER — Ambulatory Visit: Payer: BLUE CROSS/BLUE SHIELD | Admitting: Family Medicine

## 2016-10-06 ENCOUNTER — Other Ambulatory Visit: Payer: Self-pay | Admitting: Family Medicine

## 2016-10-07 ENCOUNTER — Other Ambulatory Visit: Payer: Self-pay | Admitting: Family Medicine

## 2016-10-07 DIAGNOSIS — F32A Depression, unspecified: Secondary | ICD-10-CM

## 2016-10-07 DIAGNOSIS — F329 Major depressive disorder, single episode, unspecified: Secondary | ICD-10-CM

## 2016-10-22 ENCOUNTER — Ambulatory Visit (INDEPENDENT_AMBULATORY_CARE_PROVIDER_SITE_OTHER): Payer: BLUE CROSS/BLUE SHIELD | Admitting: Physician Assistant

## 2016-10-22 ENCOUNTER — Encounter: Payer: Self-pay | Admitting: Physician Assistant

## 2016-10-22 VITALS — BP 110/62 | HR 81 | Temp 98.1°F | Wt 211.0 lb

## 2016-10-22 DIAGNOSIS — J069 Acute upper respiratory infection, unspecified: Secondary | ICD-10-CM

## 2016-10-22 DIAGNOSIS — R52 Pain, unspecified: Secondary | ICD-10-CM

## 2016-10-22 LAB — POCT INFLUENZA A/B
INFLUENZA B, POC: NEGATIVE
Influenza A, POC: NEGATIVE

## 2016-10-22 NOTE — Addendum Note (Signed)
Addended by: Thom Chimes on: 10/22/2016 04:24 PM   Modules accepted: Orders

## 2016-10-22 NOTE — Patient Instructions (Addendum)
- Theraflu cold and cough - Mucinex to make cough more productive - Drink plenty of fluids - Warm tea and honey, Salt water gargles - Rest - Follow-up if no improvement in 10 days   Upper Respiratory Infection, Adult Most upper respiratory infections (URIs) are a viral infection of the air passages leading to the lungs. A URI affects the nose, throat, and upper air passages. The most common type of URI is nasopharyngitis and is typically referred to as "the common cold." URIs run their course and usually go away on their own. Most of the time, a URI does not require medical attention, but sometimes a bacterial infection in the upper airways can follow a viral infection. This is called a secondary infection. Sinus and middle ear infections are common types of secondary upper respiratory infections. Bacterial pneumonia can also complicate a URI. A URI can worsen asthma and chronic obstructive pulmonary disease (COPD). Sometimes, these complications can require emergency medical care and may be life threatening. What are the causes? Almost all URIs are caused by viruses. A virus is a type of germ and can spread from one person to another. What increases the risk? You may be at risk for a URI if:  You smoke.  You have chronic heart or lung disease.  You have a weakened defense (immune) system.  You are very young or very old.  You have nasal allergies or asthma.  You work in crowded or poorly ventilated areas.  You work in health care facilities or schools.  What are the signs or symptoms? Symptoms typically develop 2-3 days after you come in contact with a cold virus. Most viral URIs last 7-10 days. However, viral URIs from the influenza virus (flu virus) can last 14-18 days and are typically more severe. Symptoms may include:  Runny or stuffy (congested) nose.  Sneezing.  Cough.  Sore throat.  Headache.  Fatigue.  Fever.  Loss of appetite.  Pain in your forehead,  behind your eyes, and over your cheekbones (sinus pain).  Muscle aches.  How is this diagnosed? Your health care provider may diagnose a URI by:  Physical exam.  Tests to check that your symptoms are not due to another condition such as: ? Strep throat. ? Sinusitis. ? Pneumonia. ? Asthma.  How is this treated? A URI goes away on its own with time. It cannot be cured with medicines, but medicines may be prescribed or recommended to relieve symptoms. Medicines may help:  Reduce your fever.  Reduce your cough.  Relieve nasal congestion.  Follow these instructions at home:  Take medicines only as directed by your health care provider.  Gargle warm saltwater or take cough drops to comfort your throat as directed by your health care provider.  Use a warm mist humidifier or inhale steam from a shower to increase air moisture. This may make it easier to breathe.  Drink enough fluid to keep your urine clear or pale yellow.  Eat soups and other clear broths and maintain good nutrition.  Rest as needed.  Return to work when your temperature has returned to normal or as your health care provider advises. You may need to stay home longer to avoid infecting others. You can also use a face mask and careful hand washing to prevent spread of the virus.  Increase the usage of your inhaler if you have asthma.  Do not use any tobacco products, including cigarettes, chewing tobacco, or electronic cigarettes. If you need help quitting, ask  your health care provider. How is this prevented? The best way to protect yourself from getting a cold is to practice good hygiene.  Avoid oral or hand contact with people with cold symptoms.  Wash your hands often if contact occurs.  There is no clear evidence that vitamin C, vitamin E, echinacea, or exercise reduces the chance of developing a cold. However, it is always recommended to get plenty of rest, exercise, and practice good nutrition. Contact  a health care provider if:  You are getting worse rather than better.  Your symptoms are not controlled by medicine.  You have chills.  You have worsening shortness of breath.  You have brown or red mucus.  You have yellow or brown nasal discharge.  You have pain in your face, especially when you bend forward.  You have a fever.  You have swollen neck glands.  You have pain while swallowing.  You have white areas in the back of your throat. Get help right away if:  You have severe or persistent: ? Headache. ? Ear pain. ? Sinus pain. ? Chest pain.  You have chronic lung disease and any of the following: ? Wheezing. ? Prolonged cough. ? Coughing up blood. ? A change in your usual mucus.  You have a stiff neck.  You have changes in your: ? Vision. ? Hearing. ? Thinking. ? Mood. This information is not intended to replace advice given to you by your health care provider. Make sure you discuss any questions you have with your health care provider. Document Released: 06/23/2000 Document Revised: 08/31/2015 Document Reviewed: 04/04/2013 Elsevier Interactive Patient Education  2017 [employer].

## 2016-10-22 NOTE — Progress Notes (Signed)
HPI:                                                                Jessica Cunningham is a 41 y.o. female who presents to [facility] today for URI symptoms  URI   This is a new problem. Episode onset: x 3 days. The problem has been gradually improving. There has been no fever. Associated symptoms include coughing, headaches and a sore throat. Pertinent negatives include no congestion, nausea or vomiting. Associated symptoms comments: + myalgias. She has tried nothing for the symptoms.     Past Medical History:  Diagnosis Date  . Allergy   . Depression   . Hyperlipidemia    Past Surgical History:  Procedure Laterality Date  . CESAREAN SECTION     x 2   . TONSILLECTOMY    . WISDOM TOOTH EXTRACTION     Social History  Substance Use Topics  . Smoking status: Former Smoker    Quit date: 01/11/2001  . Smokeless tobacco: Never Used  . Alcohol use 0.0 - 0.6 oz/week   family history includes Hyperlipidemia in her father and mother.  ROS: negative except as noted in the HPI  Medications: Current Outpatient Prescriptions  Medication Sig Dispense Refill  . Beclomethasone Dipropionate (QNASL) 80 MCG/ACT AERS Place 1 spray into both nostrils daily. 1 Inhaler 11  . diclofenac sodium (VOLTAREN) 1 % GEL Apply 2 g topically 4 (four) times daily. To affected joint. 100 g 11  . esomeprazole (NEXIUM) 20 MG capsule Take 1 capsule (20 mg total) by mouth daily as needed. 30 capsule 1  . fexofenadine (ALLEGRA) 180 MG tablet Take 180 mg by mouth daily.    Marland Kitchen ipratropium (ATROVENT) 0.06 % nasal spray Place 1 spray into both nostrils 4 (four) times daily as needed. 15 mL 0  . montelukast (SINGULAIR) 10 MG tablet TAKE 1 TABLET (10 MG TOTAL) BY MOUTH AT BEDTIME. 30 tablet 2  . rOPINIRole (REQUIP) 0.25 MG tablet TAKE 1-2 TABLETS (0.25-0.5 MG TOTAL) BY MOUTH AT BEDTIME. 180 tablet 1  . sertraline (ZOLOFT) 100 MG tablet Take 1 tablet (100 mg total) by mouth  daily. Please schedule a follow up visit for next refill. 90 tablet 0  . triamcinolone cream (KENALOG) 0.1 % Apply 1 application topically daily as needed. 45 g 1   No current facility-administered medications for this visit.    Allergies  Allergen Reactions  . Paxil [Paroxetine Hcl] Other (See Comments)    Made her "crazy", hallucination  . Topamax [Topiramate]     Forgetful/not been able to complete sentences.   . Wellbutrin [Bupropion] Other (See Comments)    Hand shakes       Objective:  BP 110/62   Pulse 81   Temp 98.1 F (36.7 C) (Oral)   Wt 211 lb (95.7 kg)   BMI 38.59 kg/m  Gen:  alert, not ill-appearing, no distress, appropriate for age HEENT: head normocephalic without obvious abnormality, conjunctiva and cornea clear, right TM with scarring no erythema or bulging, left TM clear, oropharynx clear without exudate, uvula midline, tender tonsillar adenopathy, trachea midline Pulm: Normal work of breathing, normal phonation, clear to auscultation bilaterally, no wheezes, rales or rhonchi CV: Normal rate, regular rhythm, s1  and s2 distinct, no murmurs, clicks or rubs  Neuro: alert and oriented x 3, no tremor MSK: extremities atraumatic, normal gait and station Skin: intact, no rashes on exposed skin,  no cyanosis  No results found for this or any previous visit (from the past 72 hour(s)). No results found.    Assessment and Plan: 41 y.o. female with   1. Acute URI - POC influenza A/B negative - symptomatic management with mucinex, OTC cough suppressant, and analgesics   Patient education and anticipatory guidance given Patient agrees with treatment plan Follow-up as needed if symptoms worsen or fail to improve  Levonne Hubert PA-C

## 2016-10-28 ENCOUNTER — Other Ambulatory Visit: Payer: Self-pay | Admitting: Family Medicine

## 2016-10-28 DIAGNOSIS — F32A Depression, unspecified: Secondary | ICD-10-CM

## 2016-10-28 DIAGNOSIS — F329 Major depressive disorder, single episode, unspecified: Secondary | ICD-10-CM

## 2016-11-08 ENCOUNTER — Ambulatory Visit (INDEPENDENT_AMBULATORY_CARE_PROVIDER_SITE_OTHER): Payer: BLUE CROSS/BLUE SHIELD | Admitting: Family Medicine

## 2016-11-08 ENCOUNTER — Encounter: Payer: Self-pay | Admitting: Family Medicine

## 2016-11-08 VITALS — BP 123/66 | HR 69 | Ht 62.0 in | Wt 212.0 lb

## 2016-11-08 DIAGNOSIS — F32A Depression, unspecified: Secondary | ICD-10-CM

## 2016-11-08 DIAGNOSIS — N760 Acute vaginitis: Secondary | ICD-10-CM

## 2016-11-08 DIAGNOSIS — G2581 Restless legs syndrome: Secondary | ICD-10-CM | POA: Diagnosis not present

## 2016-11-08 DIAGNOSIS — Z23 Encounter for immunization: Secondary | ICD-10-CM

## 2016-11-08 DIAGNOSIS — K219 Gastro-esophageal reflux disease without esophagitis: Secondary | ICD-10-CM

## 2016-11-08 DIAGNOSIS — F329 Major depressive disorder, single episode, unspecified: Secondary | ICD-10-CM

## 2016-11-08 MED ORDER — SERTRALINE HCL 100 MG PO TABS: 100.0000 mg | ORAL_TABLET | Freq: Every day | ORAL | 3 refills | Status: DC

## 2016-11-08 MED ORDER — ESOMEPRAZOLE MAGNESIUM 20 MG PO CPDR: 20.0000 mg | DELAYED_RELEASE_CAPSULE | Freq: Every day | ORAL | 3 refills | Status: DC | PRN

## 2016-11-08 MED ORDER — ROPINIROLE HCL 0.25 MG PO TABS: 0.2500 mg | ORAL_TABLET | Freq: Every day | ORAL | 1 refills | Status: DC

## 2016-11-08 NOTE — Progress Notes (Addendum)
Subjective:    Patient ID: Jessica Cunningham, female    DOB: 03-Feb-1975, 41 y.o.   MRN: [id number]  HPI Wants to be tested for a yeast infection. Has had sxs for the last several months.  Monistat helps clear it up.  She says about halfway through her menstrual cycle and the time she ovulates, and then again around menses she will get significant inflammation of the vaginal area with redness and itching and irritation.  She denies and any change in vaginal discharge around that time.  It usually does improve after couple days but she also uses Monistat around that time.  Her husband recently came in with a yeast infection in the groin.   F/U RLS -currently on Requip 0.25 mg daily.  She is doing well on medication and is requesting refills.  She usually takes 1-2 tabs but most nights 2 tabs.  She has been staying up a little bit later than usual.  Acute depression-currently on sertraline 100 mg daily.  She is doing well overall.     GERD - needs RF on nexium.  Medication is working well.    Review of Systems  BP 123/66   Pulse 69   Ht 5\' 2"  (1.575 m)   Wt 212 lb (96.2 kg)   SpO2 97%   BMI 38.78 kg/m     Allergies  Allergen Reactions  . Paxil [Paroxetine Hcl] Other (See Comments)    Made her "crazy", hallucination  . Topamax [Topiramate]     Forgetful/not been able to complete sentences.   . Wellbutrin [Bupropion] Other (See Comments)    Hand shakes    Past Medical History:  Diagnosis Date  . Allergy   . Depression   . Hyperlipidemia     Past Surgical History:  Procedure Laterality Date  . CESAREAN SECTION     x 2   . TONSILLECTOMY    . WISDOM TOOTH EXTRACTION      Social History   Social History  . Marital status: Married    Spouse name: N/A  . Number of children: 2   . Years of education: N/A   Occupational History  . Not on file.   Social History Main Topics  . Smoking status: Former Smoker    Quit date: 01/11/2001  . Smokeless tobacco: Never Used  .  Alcohol use 0.0 - 0.6 oz/week  . Drug use: No  . Sexual activity: Yes    Partners: Male    Birth control/ protection: Other-see comments     Comment: Husband vasectomy   Other Topics Concern  . Not on file   Social History Narrative   No regular exercise. Daily caffeine.     Family History  Problem Relation Age of Onset  . Hyperlipidemia Mother   . Hyperlipidemia Father     Outpatient Encounter Prescriptions as of 11/08/2016  Medication Sig  . Beclomethasone Dipropionate (QNASL) 80 MCG/ACT AERS Place 1 spray into both nostrils daily.  . diclofenac sodium (VOLTAREN) 1 % GEL Apply 2 g topically 4 (four) times daily. To affected joint.  Marland Kitchen esomeprazole (NEXIUM) 20 MG capsule Take 1 capsule (20 mg total) by mouth daily as needed.  . fexofenadine (ALLEGRA) 180 MG tablet Take 180 mg by mouth daily.  Marland Kitchen ipratropium (ATROVENT) 0.06 % nasal spray Place 1 spray into both nostrils 4 (four) times daily as needed.  . montelukast (SINGULAIR) 10 MG tablet TAKE 1 TABLET (10 MG TOTAL) BY MOUTH AT BEDTIME.  Marland Kitchen rOPINIRole (REQUIP)  0.25 MG tablet Take 1-2 tablets (0.25-0.5 mg total) by mouth at bedtime. Place on file  . sertraline (ZOLOFT) 100 MG tablet Take 1 tablet (100 mg total) by mouth daily.  Marland Kitchen. triamcinolone cream (KENALOG) 0.1 % Apply 1 application topically daily as needed.  . [DISCONTINUED] esomeprazole (NEXIUM) 20 MG capsule Take 1 capsule (20 mg total) by mouth daily as needed.  . [DISCONTINUED] rOPINIRole (REQUIP) 0.25 MG tablet TAKE 1-2 TABLETS (0.25-0.5 MG TOTAL) BY MOUTH AT BEDTIME.  . [DISCONTINUED] sertraline (ZOLOFT) 100 MG tablet Take 1 tablet (100 mg total) by mouth daily. Please schedule a follow up visit for next refill.   No facility-administered encounter medications on file as of 11/08/2016.           Objective:   Physical Exam  Constitutional: She is oriented to person, place, and time. She appears well-developed and well-nourished.  HENT:  Head: Normocephalic and  atraumatic.  Cardiovascular: Normal rate, regular rhythm and normal heart sounds.   Pulmonary/Chest: Effort normal and breath sounds normal.  Neurological: She is alert and oriented to person, place, and time.  Skin: Skin is warm and dry.  Psychiatric: She has a normal mood and affect. Her behavior is normal.          Assessment & Plan:  Vaginitis-right now she is asymptomatic and has recently used Monistat so we will get a KOH when symptoms return.  Order was placed that she can go at her convenience.  Restless leg syndrome-Requip was refilled today.  She is doing well overall and symptoms are fairly well controlled.  Acute depression - RF zoloft.  PHQ-9 score of 8.  Moderated sxs currently. Not fully in remission.  Continue current regimen.   GERD- refilled Nexium 90-day prescription.  Flu vaccine given today.

## 2016-11-23 DIAGNOSIS — Z1283 Encounter for screening for malignant neoplasm of skin: Secondary | ICD-10-CM | POA: Diagnosis not present

## 2016-11-23 DIAGNOSIS — L821 Other seborrheic keratosis: Secondary | ICD-10-CM | POA: Diagnosis not present

## 2016-11-23 DIAGNOSIS — L03319 Cellulitis of trunk, unspecified: Secondary | ICD-10-CM | POA: Diagnosis not present

## 2016-11-23 DIAGNOSIS — L820 Inflamed seborrheic keratosis: Secondary | ICD-10-CM | POA: Diagnosis not present

## 2016-12-13 ENCOUNTER — Other Ambulatory Visit: Payer: Self-pay | Admitting: *Deleted

## 2016-12-13 ENCOUNTER — Ambulatory Visit: Payer: BLUE CROSS/BLUE SHIELD

## 2016-12-13 DIAGNOSIS — N760 Acute vaginitis: Secondary | ICD-10-CM

## 2016-12-14 ENCOUNTER — Other Ambulatory Visit: Payer: Self-pay | Admitting: Family Medicine

## 2016-12-14 LAB — WET PREP FOR TRICH, YEAST, CLUE
MICRO NUMBER:: 81355809
SPECIMEN QUALITY 3963: ADEQUATE

## 2016-12-14 MED ORDER — METRONIDAZOLE 500 MG PO TABS: 500.0000 mg | ORAL_TABLET | Freq: Two times a day (BID) | ORAL | 0 refills | Status: DC

## 2017-01-04 DIAGNOSIS — Z87891 Personal history of nicotine dependence: Secondary | ICD-10-CM | POA: Diagnosis not present

## 2017-01-04 DIAGNOSIS — Z79899 Other long term (current) drug therapy: Secondary | ICD-10-CM | POA: Diagnosis not present

## 2017-01-04 DIAGNOSIS — L729 Follicular cyst of the skin and subcutaneous tissue, unspecified: Secondary | ICD-10-CM | POA: Diagnosis not present

## 2017-01-04 DIAGNOSIS — Z79891 Long term (current) use of opiate analgesic: Secondary | ICD-10-CM | POA: Diagnosis not present

## 2017-01-04 DIAGNOSIS — L720 Epidermal cyst: Secondary | ICD-10-CM | POA: Diagnosis not present

## 2017-01-06 DIAGNOSIS — L7211 Pilar cyst: Secondary | ICD-10-CM | POA: Diagnosis not present

## 2017-01-21 DIAGNOSIS — L728 Other follicular cysts of the skin and subcutaneous tissue: Secondary | ICD-10-CM | POA: Diagnosis not present

## 2017-01-21 DIAGNOSIS — L538 Other specified erythematous conditions: Secondary | ICD-10-CM | POA: Diagnosis not present

## 2017-01-21 DIAGNOSIS — L720 Epidermal cyst: Secondary | ICD-10-CM | POA: Diagnosis not present

## 2017-05-09 ENCOUNTER — Encounter: Payer: Self-pay | Admitting: Family Medicine

## 2017-05-09 ENCOUNTER — Ambulatory Visit: Payer: BLUE CROSS/BLUE SHIELD | Admitting: Family Medicine

## 2017-05-09 VITALS — BP 140/59 | HR 70 | Temp 98.1°F | Wt 207.0 lb

## 2017-05-09 DIAGNOSIS — J40 Bronchitis, not specified as acute or chronic: Secondary | ICD-10-CM | POA: Diagnosis not present

## 2017-05-09 DIAGNOSIS — R0602 Shortness of breath: Secondary | ICD-10-CM | POA: Diagnosis not present

## 2017-05-09 MED ORDER — PREDNISONE 10 MG PO TABS: 30.0000 mg | ORAL_TABLET | Freq: Every day | ORAL | 0 refills | Status: DC

## 2017-05-09 MED ORDER — ALBUTEROL SULFATE HFA 108 (90 BASE) MCG/ACT IN AERS: 2.0000 | INHALATION_SPRAY | Freq: Four times a day (QID) | RESPIRATORY_TRACT | 0 refills | Status: DC | PRN

## 2017-05-09 MED ORDER — IPRATROPIUM-ALBUTEROL 0.5-2.5 (3) MG/3ML IN SOLN
3.0000 mL | Freq: Once | RESPIRATORY_TRACT | Status: AC
Start: 2017-05-09 — End: 2017-05-09
  Administered 2017-05-09: 3 mL via RESPIRATORY_TRACT

## 2017-05-09 MED ORDER — BENZONATATE 200 MG PO CAPS: 200.0000 mg | ORAL_CAPSULE | Freq: Three times a day (TID) | ORAL | 0 refills | Status: DC | PRN

## 2017-05-09 NOTE — Patient Instructions (Signed)
Thank you for coming in today. Take the prednisone daily for 5 days.  Use albuterol every 4-6 hours as needed for cough, wheezing or shortness or breath or chest tightness.  Use the tessalon pearles every 8 hours as needed.  If not better let me know Call or go to the emergency room if you get worse, have trouble breathing, have chest pains, or palpitations.    Acute Bronchitis, Adult Acute bronchitis is sudden (acute) swelling of the air tubes (bronchi) in the lungs. Acute bronchitis causes these tubes to fill with mucus, which can make it hard to breathe. It can also cause coughing or wheezing. In adults, acute bronchitis usually goes away within 2 weeks. A cough caused by bronchitis may last up to 3 weeks. Smoking, allergies, and asthma can make the condition worse. Repeated episodes of bronchitis may cause further lung problems, such as chronic obstructive pulmonary disease (COPD). What are the causes? This condition can be caused by germs and by substances that irritate the lungs, including:  Cold and flu viruses. This condition is most often caused by the same virus that causes a cold.  Bacteria.  Exposure to tobacco smoke, dust, fumes, and air pollution.  What increases the risk? This condition is more likely to develop in people who:  Have close contact with someone with acute bronchitis.  Are exposed to lung irritants, such as tobacco smoke, dust, fumes, and vapors.  Have a weak immune system.  Have a respiratory condition such as asthma.  What are the signs or symptoms? Symptoms of this condition include:  A cough.  Coughing up clear, yellow, or green mucus.  Wheezing.  Chest congestion.  Shortness of breath.  A fever.  Body aches.  Chills.  A sore throat.  How is this diagnosed? This condition is usually diagnosed with a physical exam. During the exam, your health care provider may order tests, such as chest X-rays, to rule out other conditions. He or  she may also:  Test a sample of your mucus for bacterial infection.  Check the level of oxygen in your blood. This is done to check for pneumonia.  Do a chest X-ray or lung function testing to rule out pneumonia and other conditions.  Perform blood tests.  Your health care provider will also ask about your symptoms and medical history. How is this treated? Most cases of acute bronchitis clear up over time without treatment. Your health care provider may recommend:  Drinking more fluids. Drinking more makes your mucus thinner, which may make it easier to breathe.  Taking a medicine for a fever or cough.  Taking an antibiotic medicine.  Using an inhaler to help improve shortness of breath and to control a cough.  Using a cool mist vaporizer or humidifier to make it easier to breathe.  Follow these instructions at home: Medicines  Take over-the-counter and prescription medicines only as told by your health care provider.  If you were prescribed an antibiotic, take it as told by your health care provider. Do not stop taking the antibiotic even if you start to feel better. General instructions  Get plenty of rest.  Drink enough fluids to keep your urine clear or pale yellow.  Avoid smoking and secondhand smoke. Exposure to cigarette smoke or irritating chemicals will make bronchitis worse. If you smoke and you need help quitting, ask your health care provider. Quitting smoking will help your lungs heal faster.  Use an inhaler, cool mist vaporizer, or humidifier as told  by your health care provider.  Keep all follow-up visits as told by your health care provider. This is important. How is this prevented? To lower your risk of getting this condition again:  Wash your hands often with soap and water. If soap and water are not available, use hand sanitizer.  Avoid contact with people who have cold symptoms.  Try not to touch your hands to your mouth, nose, or eyes.  Make sure  to get the flu shot every year.  Contact a health care provider if:  Your symptoms do not improve in 2 weeks of treatment. Get help right away if:  You cough up blood.  You have chest pain.  You have severe shortness of breath.  You become dehydrated.  You faint or keep feeling like you are going to faint.  You keep vomiting.  You have a severe headache.  Your fever or chills gets worse. This information is not intended to replace advice given to you by your health care provider. Make sure you discuss any questions you have with your health care provider. Document Released: 02/05/2004 Document Revised: 07/23/2015 Document Reviewed: 06/18/2015 Elsevier Interactive Patient Education  [employer].

## 2017-05-09 NOTE — Progress Notes (Addendum)
Jessica Cunningham is a 42 y.o. female who presents to [facility] today for shortness of breath.   Shortness of breath: Jessica Cunningham has a history of seasonal allergies and has been treating her seasonal allergies aggressively with Singulair, allegra, and mucinex. Despite this, she began having a moderate cough and shortness of breath last Tuesday. She denies fever, sick contacts, edema, and chest pain. She endorses a sore throat. She states she has had episodes of bronchitis that have been treated with steroids before.  She notes some mild wheezing and shortness of breath.  She denies severe chest pain palpitations orthopnea or severe shortness of breath or shortness of breath worse with exertion.  She is tried some over-the-counter medications but has not tried albuterol yet.   Past Medical History:  Diagnosis Date  . Allergy   . Depression   . Hyperlipidemia    Past Surgical History:  Procedure Laterality Date  . CESAREAN SECTION     x 2   . TONSILLECTOMY    . WISDOM TOOTH EXTRACTION     Social History   Tobacco Use  . Smoking status: Former Smoker    Last attempt to quit: 01/11/2001    Years since quitting: 16.3  . Smokeless tobacco: Never Used  Substance Use Topics  . Alcohol use: Yes    Alcohol/week: 0.0 - 0.6 oz   family history includes Hyperlipidemia in her father and mother.  ROS as above:  Medications: Current Outpatient Medications  Medication Sig Dispense Refill  . Beclomethasone Dipropionate (QNASL) 80 MCG/ACT AERS Place 1 spray into both nostrils daily. 1 Inhaler 11  . diclofenac sodium (VOLTAREN) 1 % GEL Apply 2 g topically 4 (four) times daily. To affected joint. 100 g 11  . esomeprazole (NEXIUM) 20 MG capsule Take 1 capsule (20 mg total) by mouth daily as needed. 90 capsule 3  . fexofenadine (ALLEGRA) 180 MG tablet Take 180 mg by mouth daily.     Marland Kitchen ipratropium (ATROVENT) 0.06 % nasal spray Place 1 spray into both nostrils 4 (four) times daily as needed. 15 mL 0  . montelukast (SINGULAIR) 10 MG tablet TAKE 1 TABLET (10 MG TOTAL) BY MOUTH AT BEDTIME. 30 tablet 2  . rOPINIRole (REQUIP) 0.25 MG tablet Take 1-2 tablets (0.25-0.5 mg total) by mouth at bedtime. Place on file 180 tablet 1  . sertraline (ZOLOFT) 100 MG tablet Take 1 tablet (100 mg total) by mouth daily. 90 tablet 3  . triamcinolone cream (KENALOG) 0.1 % Apply 1 application topically daily as needed. 45 g 1  . albuterol (PROVENTIL HFA;VENTOLIN HFA) 108 (90 Base) MCG/ACT inhaler Inhale 2 puffs into the lungs every 6 (six) hours as needed for wheezing or shortness of breath. 1 Inhaler 0  . benzonatate (TESSALON) 200 MG capsule Take 1 capsule (200 mg total) by mouth 3 (three) times daily as needed for cough. 45 capsule 0  . predniSONE (DELTASONE) 10 MG tablet Take 3 tablets (30 mg total) by mouth daily with breakfast. 15 tablet 0   No current facility-administered medications for this visit.    Allergies  Allergen Reactions  . Paxil [Paroxetine Hcl] Other (See Comments)    Made her "crazy", hallucination  . Topamax [Topiramate]     Forgetful/not been able to complete sentences.   . Wellbutrin [Bupropion] Other (See Comments)    Hand shakes    Health Maintenance Health Maintenance  Topic Date Due  . INFLUENZA VACCINE  08/11/2017  . PAP SMEAR  12/16/2017  . TETANUS/TDAP  01/18/2026  . HIV Screening  Completed     Exam:  BP (!) 140/59   Pulse 70   Temp 98.1 F (36.7 C) (Oral)   Wt 207 lb (93.9 kg)   SpO2 98%   BMI 37.86 kg/m  Gen: Well NAD HEENT: EOMI,  MMM clear nasal discharge with inflamed nasal turbinates bilateral.  Posterior pharynx with cobblestoning. Lungs: Normal work of breathing. Prolonged expiratory phase with wheezing.  Heart: RRR no MRG Abd: NABS, Soft. Nondistended, Nontender Exts: Brisk capillary refill, warm and well perfused.   Patient was  given a 2.5/0.5mg  duoneb nebulizer treatment and felt better.       Assessment and Plan: 42 y.o. female with   Shortness of breath: Patient's physical exam and story point to an astma like aggravation of her lungs. She is wheezing and having SOB. Symptoms improved with duoneb indicating a RAD/Bronchitis component. Pneumonia is very unlikely. We will prescribe oral prednisone to help calm down the inflammation in her air ways. If she does not improve, she should return for further evaluation.  Treat also empirically with Tessalon Perles and albuterol.   No orders of the defined types were placed in this encounter.  Meds ordered this encounter  Medications  . ipratropium-albuterol (DUONEB) 0.5-2.5 (3) MG/3ML nebulizer solution 3 mL  . albuterol (PROVENTIL HFA;VENTOLIN HFA) 108 (90 Base) MCG/ACT inhaler    Sig: Inhale 2 puffs into the lungs every 6 (six) hours as needed for wheezing or shortness of breath.    Dispense:  1 Inhaler    Refill:  0  . predniSONE (DELTASONE) 10 MG tablet    Sig: Take 3 tablets (30 mg total) by mouth daily with breakfast.    Dispense:  15 tablet    Refill:  0  . benzonatate (TESSALON) 200 MG capsule    Sig: Take 1 capsule (200 mg total) by mouth 3 (three) times daily as needed for cough.    Dispense:  45 capsule    Refill:  0     Discussed warning signs or symptoms. Please see discharge instructions. Patient expresses understanding.  This patient was seen and interviewed and examined independently by myself. Clementeen Graham, MD

## 2017-06-27 ENCOUNTER — Ambulatory Visit: Payer: BLUE CROSS/BLUE SHIELD | Admitting: Family Medicine

## 2017-09-22 ENCOUNTER — Encounter: Payer: Self-pay | Admitting: Family Medicine

## 2017-10-06 ENCOUNTER — Other Ambulatory Visit: Payer: Self-pay | Admitting: Family Medicine

## 2017-10-10 ENCOUNTER — Ambulatory Visit: Payer: BLUE CROSS/BLUE SHIELD | Admitting: Family Medicine

## 2017-10-10 ENCOUNTER — Other Ambulatory Visit: Payer: Self-pay | Admitting: Family Medicine

## 2017-10-10 ENCOUNTER — Ambulatory Visit (INDEPENDENT_AMBULATORY_CARE_PROVIDER_SITE_OTHER): Payer: BLUE CROSS/BLUE SHIELD

## 2017-10-10 ENCOUNTER — Encounter: Payer: Self-pay | Admitting: Family Medicine

## 2017-10-10 VITALS — BP 139/69 | HR 86 | Ht 62.0 in | Wt 202.0 lb

## 2017-10-10 DIAGNOSIS — N760 Acute vaginitis: Secondary | ICD-10-CM

## 2017-10-10 DIAGNOSIS — L910 Hypertrophic scar: Secondary | ICD-10-CM

## 2017-10-10 DIAGNOSIS — Z1231 Encounter for screening mammogram for malignant neoplasm of breast: Secondary | ICD-10-CM

## 2017-10-10 DIAGNOSIS — M25522 Pain in left elbow: Secondary | ICD-10-CM

## 2017-10-10 LAB — WET PREP FOR TRICH, YEAST, CLUE
MICRO NUMBER: 91171045
SPECIMEN QUALITY 3963: ADEQUATE

## 2017-10-10 NOTE — Progress Notes (Signed)
Subjective:    Patient ID: Jessica Cunningham, female    DOB: 08-Jun-1975, 42 y.o.   MRN: [id number]  HPI 42 year old female comes in today complaining of a scar on her left elbow that is causing pain.  She says back in May she actually fell on gravel and really scraped her left elbow up pretty good overall.  She says it took quite a while for it to heal but it eventually did but it left a raised scar.  She said occasionally fluid would actually come out of it.  Last time it drained was about a month ago.  No fevers chills or sweats.  But she says now she will just have times where it will just randomly burn.  She is that she can is sitting watching television doing nothing and then all of a sudden it feels like someone lit a match to her elbow.  She also complains of vaginal irritation and itching.  Always occurs midcycle and then seems to flareup during her menstrual cycle and then seems to get a little bit better.  She says the last time she was treated with Diflucan it actually helped for several months but now her symptoms are back.    Review of Systems  BP 139/69   Pulse 86   Ht 5\' 2"  (1.575 m)   Wt 202 lb (91.6 kg)   SpO2 98%   BMI 36.95 kg/m     Allergies  Allergen Reactions  . Paxil [Paroxetine Hcl] Other (See Comments)    Made her "crazy", hallucination  . Topamax [Topiramate]     Forgetful/not been able to complete sentences.   . Wellbutrin [Bupropion] Other (See Comments)    Hand shakes    Past Medical History:  Diagnosis Date  . Allergy   . Depression   . Hyperlipidemia     Past Surgical History:  Procedure Laterality Date  . CESAREAN SECTION     x 2   . TONSILLECTOMY    . WISDOM TOOTH EXTRACTION      Social History   Socioeconomic History  . Marital status: Married    Spouse name: Not on file  . Number of children: 2   . Years of education: Not on file  . Highest education level: Not on file  Occupational History  . Not on file  Social Needs  .  Financial resource strain: Not on file  . Food insecurity:    Worry: Not on file    Inability: Not on file  . Transportation needs:    Medical: Not on file    Non-medical: Not on file  Tobacco Use  . Smoking status: Former Smoker    Last attempt to quit: 01/11/2001    Years since quitting: 16.7  . Smokeless tobacco: Never Used  Substance and Sexual Activity  . Alcohol use: Yes    Alcohol/week: 0.0 - 1.0 standard drinks  . Drug use: No  . Sexual activity: Yes    Partners: Male    Birth control/protection: Other-see comments    Comment: Husband vasectomy  Lifestyle  . Physical activity:    Days per week: Not on file    Minutes per session: Not on file  . Stress: Not on file  Relationships  . Social connections:    Talks on phone: Not on file    Gets together: Not on file    Attends religious service: Not on file    Active member of club or organization: Not on file  Attends meetings of clubs or organizations: Not on file    Relationship status: Not on file  . Intimate partner violence:    Fear of current or ex partner: Not on file    Emotionally abused: Not on file    Physically abused: Not on file    Forced sexual activity: Not on file  Other Topics Concern  . Not on file  Social History Narrative   No regular exercise. Daily caffeine.     Family History  Problem Relation Age of Onset  . Hyperlipidemia Mother   . Hyperlipidemia Father     Outpatient Encounter Medications as of 10/10/2017  Medication Sig  . Beclomethasone Dipropionate (QNASL) 80 MCG/ACT AERS Place 1 spray into both nostrils daily.  . diclofenac sodium (VOLTAREN) 1 % GEL Apply 2 g topically 4 (four) times daily. To affected joint.  Marland Kitchen esomeprazole (NEXIUM) 20 MG capsule Take 1 capsule (20 mg total) by mouth daily as needed.  . fexofenadine (ALLEGRA) 180 MG tablet Take 180 mg by mouth daily.  Marland Kitchen ipratropium (ATROVENT) 0.06 % nasal spray Place 1 spray into both nostrils 4 (four) times daily as needed.   . montelukast (SINGULAIR) 10 MG tablet TAKE 1 TABLET (10 MG TOTAL) BY MOUTH AT BEDTIME.  Marland Kitchen rOPINIRole (REQUIP) 0.25 MG tablet TAKE 1-2 TABLETS (0.25-0.5 MG TOTAL) BY MOUTH AT BEDTIME. PLACE ON FILE  . sertraline (ZOLOFT) 100 MG tablet Take 1 tablet (100 mg total) by mouth daily.  Marland Kitchen triamcinolone cream (KENALOG) 0.1 % Apply 1 application topically daily as needed.  . [DISCONTINUED] albuterol (PROVENTIL HFA;VENTOLIN HFA) 108 (90 Base) MCG/ACT inhaler Inhale 2 puffs into the lungs every 6 (six) hours as needed for wheezing or shortness of breath.  . [DISCONTINUED] benzonatate (TESSALON) 200 MG capsule Take 1 capsule (200 mg total) by mouth 3 (three) times daily as needed for cough.  . [DISCONTINUED] predniSONE (DELTASONE) 10 MG tablet Take 3 tablets (30 mg total) by mouth daily with breakfast.  . [DISCONTINUED] rOPINIRole (REQUIP) 0.25 MG tablet Take 1-2 tablets (0.25-0.5 mg total) by mouth at bedtime. Place on file   No facility-administered encounter medications on file as of 10/10/2017.          Objective:   Physical Exam  Constitutional: She is oriented to person, place, and time. She appears well-developed and well-nourished.  HENT:  Head: Normocephalic and atraumatic.  Eyes: Conjunctivae and EOM are normal.  Cardiovascular: Normal rate.  Pulmonary/Chest: Effort normal.  Musculoskeletal:  Elbow with normal flexion and extension.  Laterally she has a raised scar.  It seems actively to have a little bit of induration.  When you put pressure on it it actually turns pale.  Otherwise no surrounding erythema.  No pustules.  No open drainage.  Neurological: She is alert and oriented to person, place, and time.  Skin: Skin is dry. No pallor.  Psychiatric: She has a normal mood and affect. Her behavior is normal.  Vitals reviewed.       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  She could have a piece piece of retained gravel in the elbow.  If it is negative then we can just continue to monitor at work consider referral to dermatology for more definitive treatment of the hypertrophic scar.  Vaginitis-KOH performed.  Will call with results once available.

## 2017-10-13 ENCOUNTER — Ambulatory Visit (INDEPENDENT_AMBULATORY_CARE_PROVIDER_SITE_OTHER): Payer: BLUE CROSS/BLUE SHIELD

## 2017-10-13 DIAGNOSIS — Z1231 Encounter for screening mammogram for malignant neoplasm of breast: Secondary | ICD-10-CM | POA: Diagnosis not present

## 2018-01-10 ENCOUNTER — Other Ambulatory Visit: Payer: Self-pay | Admitting: Family Medicine

## 2018-01-10 DIAGNOSIS — K219 Gastro-esophageal reflux disease without esophagitis: Secondary | ICD-10-CM

## 2018-02-21 DIAGNOSIS — Z6838 Body mass index (BMI) 38.0-38.9, adult: Secondary | ICD-10-CM | POA: Diagnosis not present

## 2018-02-21 DIAGNOSIS — Z01419 Encounter for gynecological examination (general) (routine) without abnormal findings: Secondary | ICD-10-CM | POA: Diagnosis not present

## 2018-03-27 ENCOUNTER — Ambulatory Visit: Payer: BLUE CROSS/BLUE SHIELD | Admitting: Family Medicine

## 2018-03-27 ENCOUNTER — Telehealth: Payer: Self-pay | Admitting: Family Medicine

## 2018-03-27 ENCOUNTER — Encounter: Payer: Self-pay | Admitting: *Deleted

## 2018-03-27 ENCOUNTER — Encounter: Payer: Self-pay | Admitting: Family Medicine

## 2018-03-27 DIAGNOSIS — F329 Major depressive disorder, single episode, unspecified: Secondary | ICD-10-CM

## 2018-03-27 DIAGNOSIS — F32A Depression, unspecified: Secondary | ICD-10-CM

## 2018-03-27 MED ORDER — SERTRALINE HCL 100 MG PO TABS: 100.0000 mg | ORAL_TABLET | Freq: Every day | ORAL | 0 refills | Status: DC

## 2018-03-27 NOTE — Telephone Encounter (Signed)
PT declined appointment for med refill. She said, "Just to send the medication in without any appointment, If there is any issue just have DR.Metheney give me a call."   Appointment Canceled hour before.   Please Advise.

## 2018-03-27 NOTE — Telephone Encounter (Signed)
rx sent for 90 days.  Can schedule f/u at that time.

## 2018-03-27 NOTE — Telephone Encounter (Signed)
Pt sent a mychart message about not coming in today for her appt. Laureen Ochs, Viann Shove, CMA

## 2018-03-27 NOTE — Addendum Note (Signed)
Addended by: Nani Gasser D on: 03/27/2018 02:27 PM   Modules accepted: Orders

## 2018-03-27 NOTE — Telephone Encounter (Signed)
It was sent

## 2018-06-19 ENCOUNTER — Other Ambulatory Visit: Payer: Self-pay | Admitting: Family Medicine

## 2018-06-19 DIAGNOSIS — F32A Depression, unspecified: Secondary | ICD-10-CM

## 2018-06-19 DIAGNOSIS — F329 Major depressive disorder, single episode, unspecified: Secondary | ICD-10-CM

## 2018-07-13 ENCOUNTER — Other Ambulatory Visit: Payer: Self-pay | Admitting: Family Medicine

## 2018-07-13 DIAGNOSIS — F329 Major depressive disorder, single episode, unspecified: Secondary | ICD-10-CM

## 2018-07-13 DIAGNOSIS — F32A Depression, unspecified: Secondary | ICD-10-CM

## 2018-07-27 ENCOUNTER — Other Ambulatory Visit: Payer: Self-pay | Admitting: Family Medicine

## 2018-07-27 DIAGNOSIS — F329 Major depressive disorder, single episode, unspecified: Secondary | ICD-10-CM

## 2018-07-27 DIAGNOSIS — F32A Depression, unspecified: Secondary | ICD-10-CM

## 2018-08-02 ENCOUNTER — Telehealth (INDEPENDENT_AMBULATORY_CARE_PROVIDER_SITE_OTHER): Payer: BC Managed Care – PPO | Admitting: Family Medicine

## 2018-08-02 ENCOUNTER — Encounter: Payer: Self-pay | Admitting: Family Medicine

## 2018-08-02 VITALS — Temp 97.9°F | Wt 209.0 lb

## 2018-08-02 DIAGNOSIS — F32A Depression, unspecified: Secondary | ICD-10-CM

## 2018-08-02 DIAGNOSIS — K219 Gastro-esophageal reflux disease without esophagitis: Secondary | ICD-10-CM | POA: Diagnosis not present

## 2018-08-02 DIAGNOSIS — G2581 Restless legs syndrome: Secondary | ICD-10-CM | POA: Diagnosis not present

## 2018-08-02 DIAGNOSIS — F329 Major depressive disorder, single episode, unspecified: Secondary | ICD-10-CM | POA: Diagnosis not present

## 2018-08-02 DIAGNOSIS — E785 Hyperlipidemia, unspecified: Secondary | ICD-10-CM | POA: Diagnosis not present

## 2018-08-02 MED ORDER — SERTRALINE HCL 100 MG PO TABS: 100.0000 mg | ORAL_TABLET | Freq: Every day | ORAL | 2 refills | Status: DC

## 2018-08-02 MED ORDER — ESOMEPRAZOLE MAGNESIUM 20 MG PO CPDR: 20.0000 mg | DELAYED_RELEASE_CAPSULE | Freq: Every day | ORAL | 3 refills | Status: DC | PRN

## 2018-08-02 NOTE — Assessment & Plan Note (Signed)
She mostly uses only one tab of the ropinorole.

## 2018-08-02 NOTE — Assessment & Plan Note (Signed)
Doing well on the sertraline 

## 2018-08-02 NOTE — Progress Notes (Signed)
Virtual Visit via Video Note  I connected with Jessica Cunningham on 08/02/18 at  8:10 AM EDT by a video enabled telemedicine application and verified that I am speaking with the correct person using two identifiers.   I discussed the limitations of evaluation and management by telemedicine and the availability of in person appointments. The patient expressed understanding and agreed to proceed.  Pt was at home and I was in my office for the virtual visit.     Subjective:    CC: Follow-up medications.  HPI: F/u acute depression - she is happy with her regimen.  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  Her kids have been home from school and so that has been challenging as well but overall feels like for the most part she is doing well on her current regimen and would like some refills sent to the pharmacy.  F/U GERD - she is doing well on her reflux medication.  She takes it every other day most of the time but sometimes needs to take it daily.  Waking up every night around midnight.  Tried low dose of melatonin.  Once awake she has a hard time falling asleep. She only has caffeine in the morning.  No screentime before bedtime.  The bedroom is dark, cool or quiet.   Past medical history, Surgical history, Family history not pertinant except as noted below, Social history, Allergies, and medications have been entered into the medical record, reviewed, and corrections made.   Review of Systems: No fevers, chills, night sweats, weight loss, chest pain, or shortness of breath.   Objective:    General: Speaking clearly in complete sentences without any shortness of breath.  Alert and oriented x3.  Normal judgment. No apparent acute distress.  Well-groomed.    Impression and Recommendations:   Acute depression - PHQ  - 9 score 3. She is happy with her current regimen. Jessica refill medication.  Follow-up in 6 to 9 months.  GERD - Jessica refill medication she tries to take it every other day but sometimes needs to take it daily.  Can just continue to work on reflux diet.  Sleep disturbance -  Discussed some strategies for sleep.  She can also try valerian root over-the-counter and or Benadryl since the melatonin is not seeming to help.  If it continues please let me know.  Hyperlipidemia -will do to recheck lipids.  She has been taking some krill oil and is hoping that that would help lower her LDL.       I discussed the assessment and treatment plan with the patient. The patient was provided an opportunity to ask questions and all were answered. The patient agreed with the plan and demonstrated an understanding of the instructions.   The patient was advised to call back or seek an in-person evaluation if the symptoms worsen or if the condition fails to improve as anticipated.   Beatrice Lecher, MD

## 2018-10-16 IMAGING — DX DG ELBOW COMPLETE 3+V*L*
4 series · 4 of 4 positions shown · non-contrast
Comparison: None.

CLINICAL DATA: Left elbow pain since May 2017 when the patient fell
onto a gravel service. Concern for subcutaneous foreign body.

EXAM:
LEFT ELBOW - COMPLETE 3+ VIEW

[elbow ap]
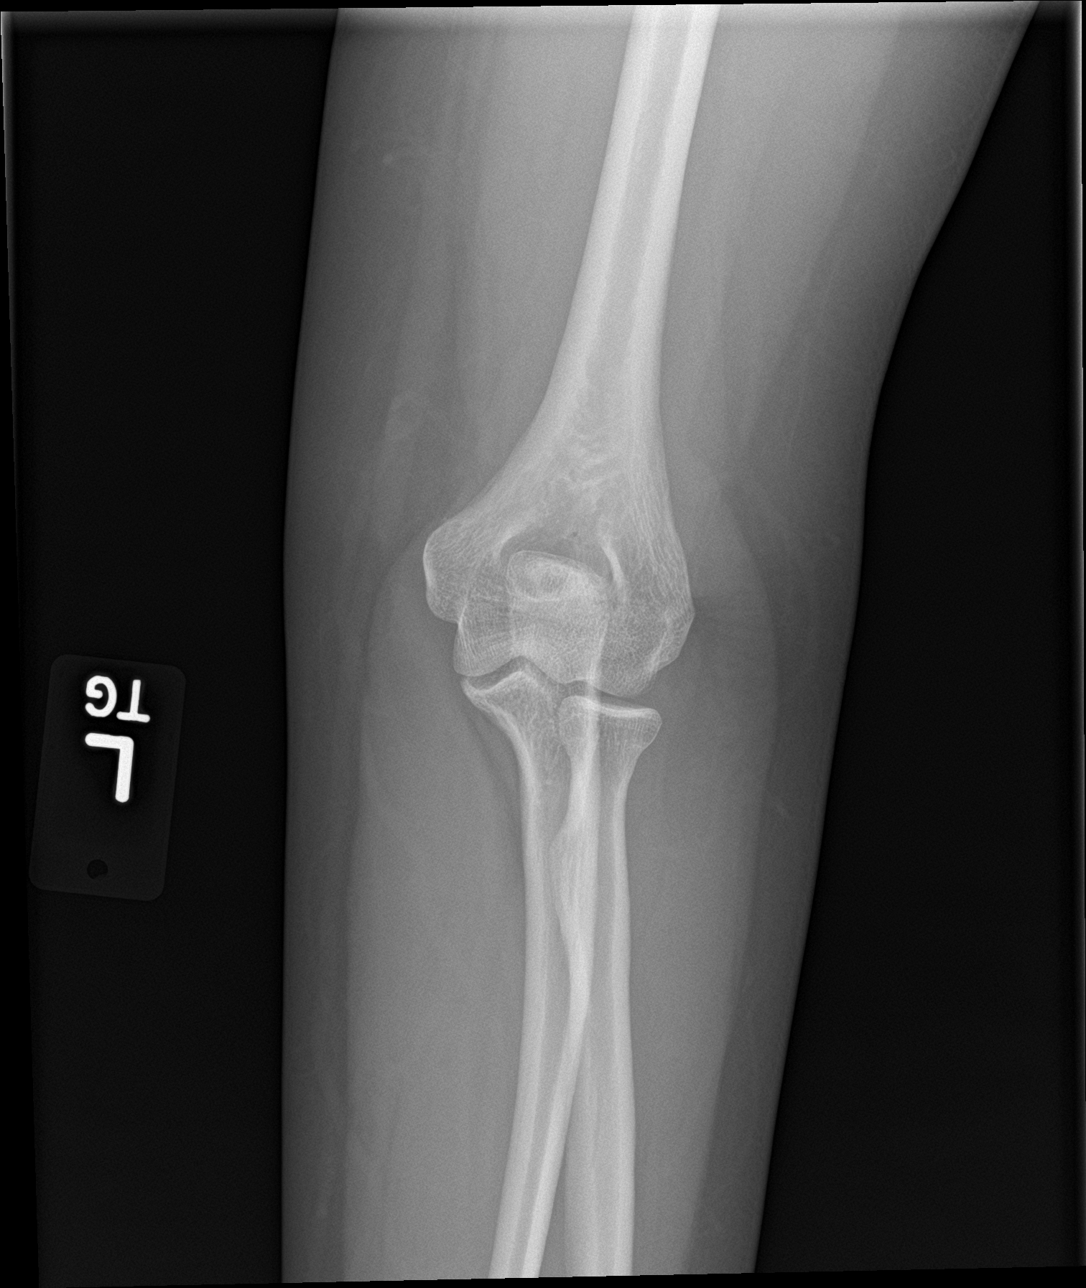

[elbow lat]
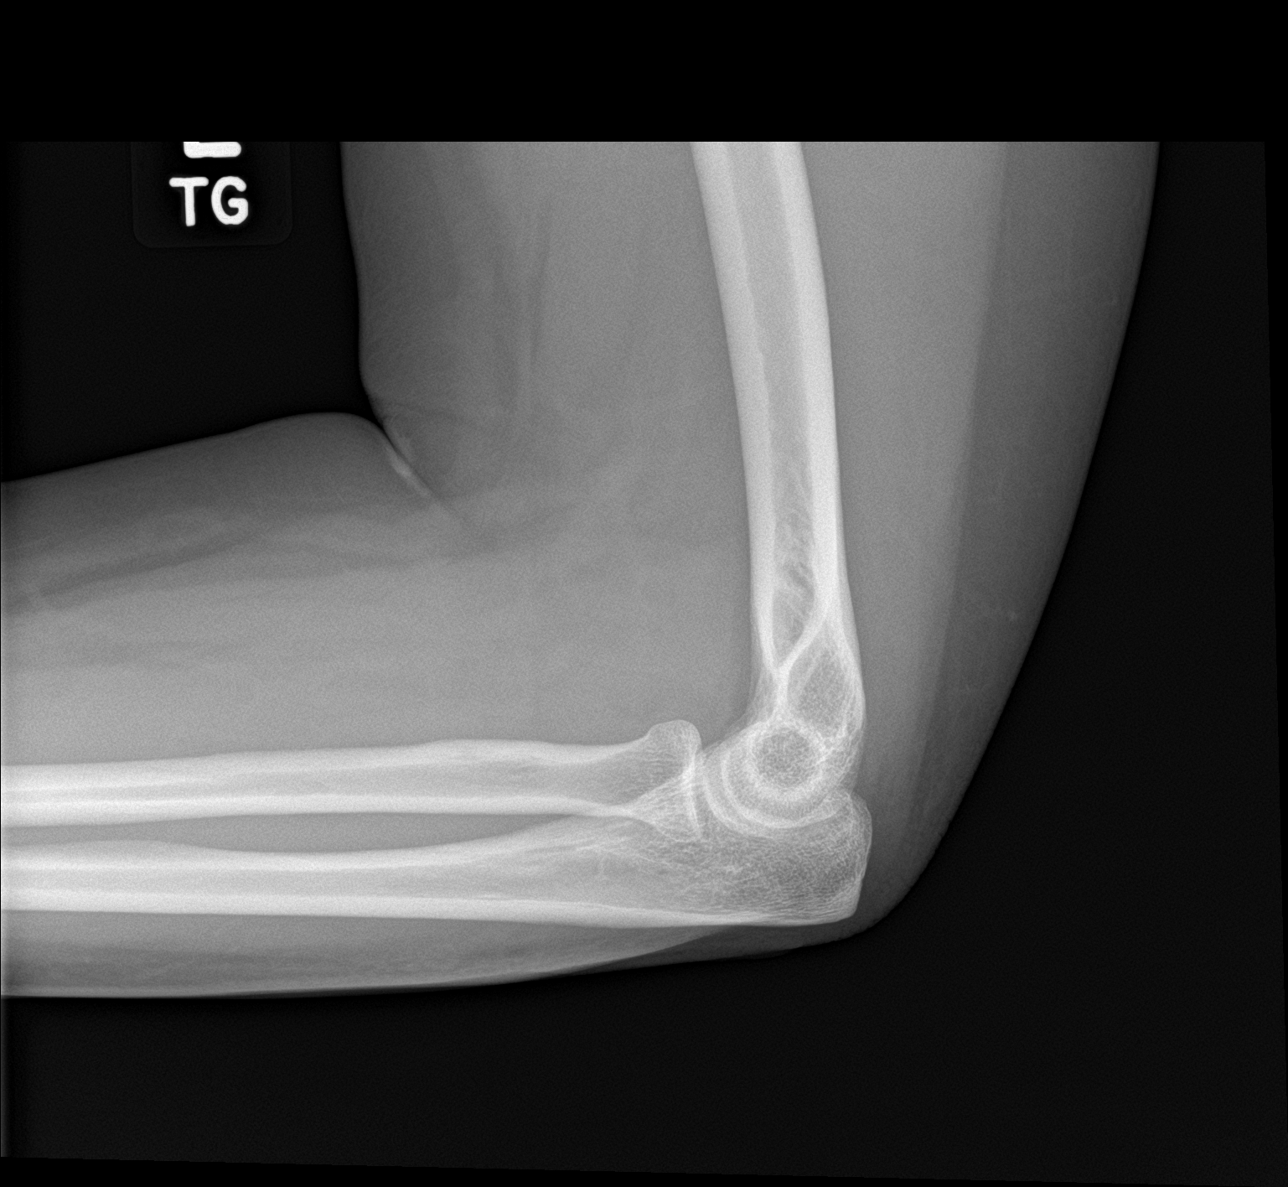

[elbow obl (1 of 2)]
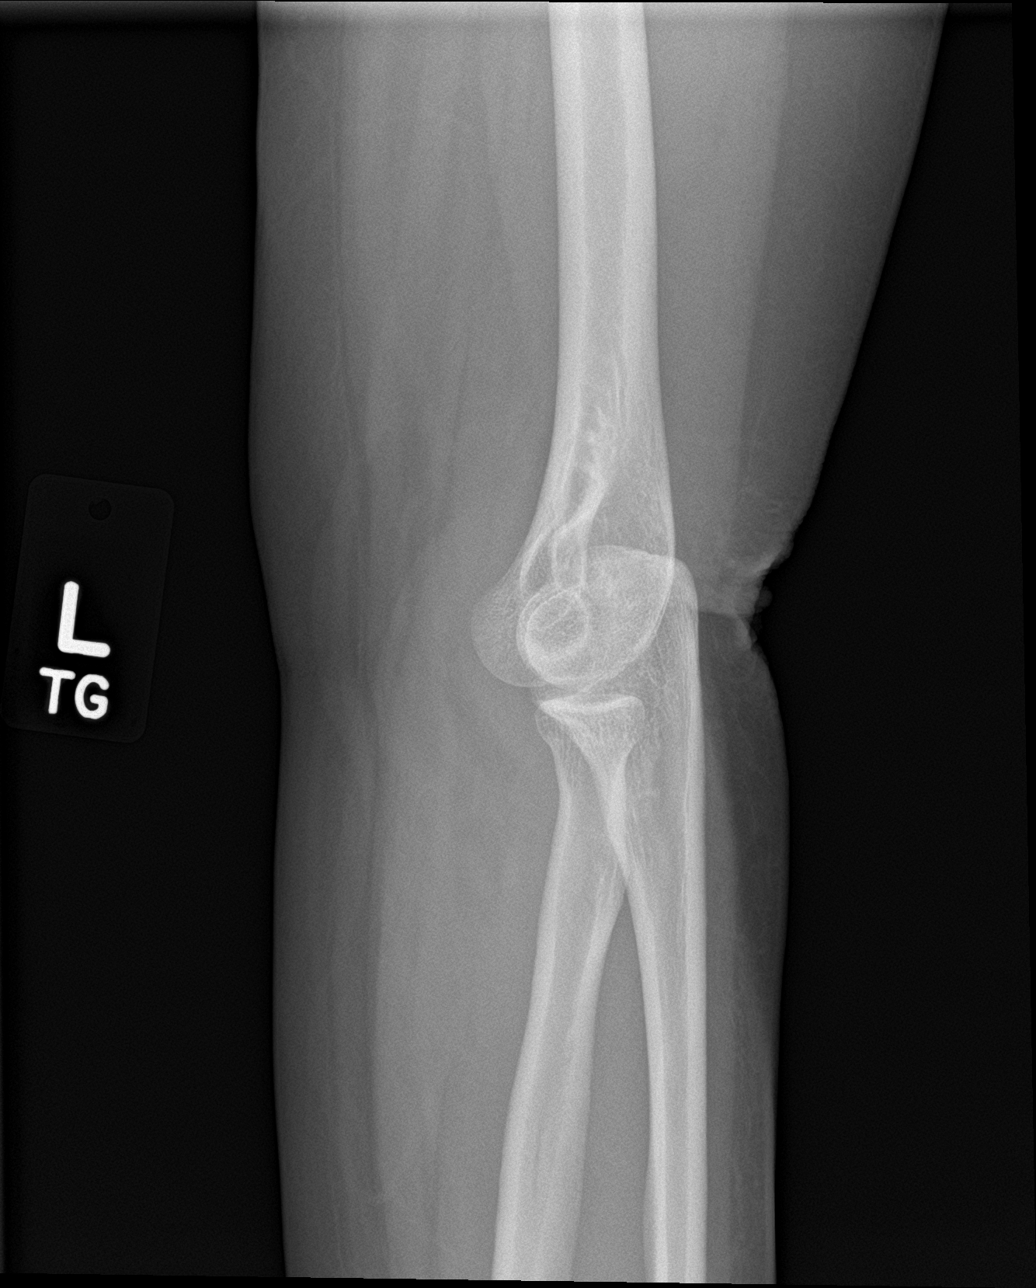

[elbow obl (2 of 2)]
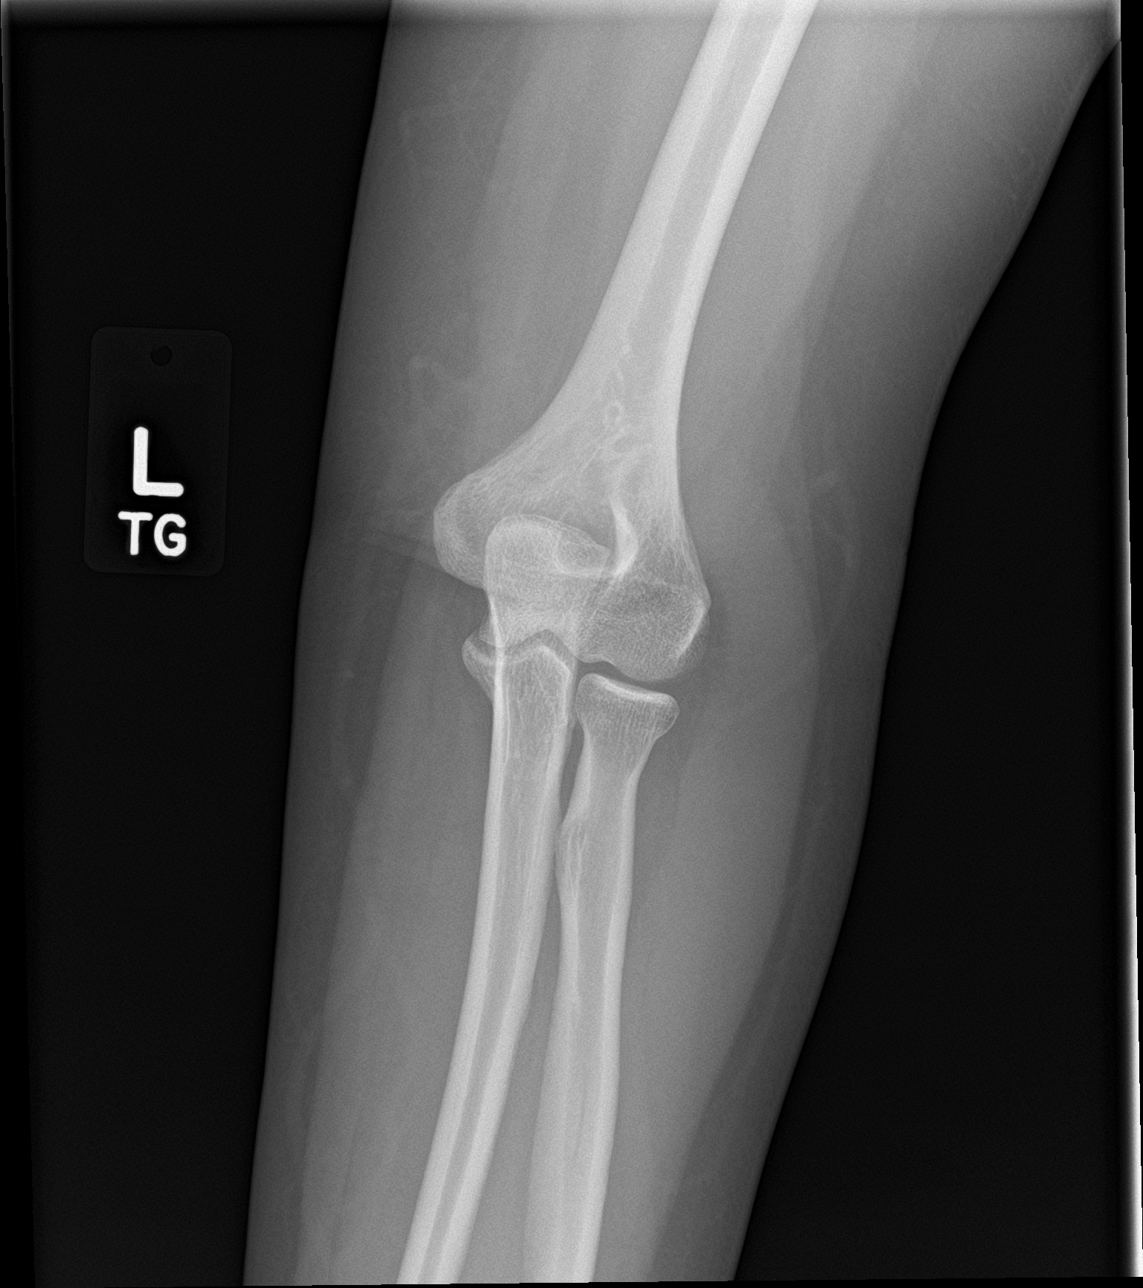

[4 of 4 positions shown; findings below may reference images not displayed]

FINDINGS: The bones are subjectively adequately mineralized. No acute or old
fracture is observed. There is no joint effusion. No significant
arthropathy is observed. No definite radiopaque foreign body is
observed.
IMPRESSION: Normal appearance of the bony and soft tissues of the left elbow.

## 2019-03-16 DIAGNOSIS — R0981 Nasal congestion: Secondary | ICD-10-CM | POA: Diagnosis not present

## 2019-03-16 DIAGNOSIS — J029 Acute pharyngitis, unspecified: Secondary | ICD-10-CM | POA: Diagnosis not present

## 2019-03-16 DIAGNOSIS — R5383 Other fatigue: Secondary | ICD-10-CM | POA: Diagnosis not present

## 2019-03-16 DIAGNOSIS — Z20822 Contact with and (suspected) exposure to covid-19: Secondary | ICD-10-CM | POA: Diagnosis not present

## 2019-05-10 ENCOUNTER — Other Ambulatory Visit: Payer: Self-pay | Admitting: Family Medicine

## 2019-05-10 DIAGNOSIS — F32A Depression, unspecified: Secondary | ICD-10-CM

## 2019-05-10 DIAGNOSIS — F329 Major depressive disorder, single episode, unspecified: Secondary | ICD-10-CM

## 2019-05-22 LAB — BASIC METABOLIC PANEL: Glucose: 92

## 2019-05-22 LAB — HEMOGLOBIN A1C: Hemoglobin A1C: 5.5

## 2019-05-22 LAB — LIPID PANEL
Cholesterol: 174 (ref 0–200)
HDL: 33 — AB (ref 35–70)
LDL Cholesterol: 113
Triglycerides: 140 (ref 40–160)

## 2019-06-07 ENCOUNTER — Other Ambulatory Visit: Payer: Self-pay | Admitting: Family Medicine

## 2019-06-07 DIAGNOSIS — F32A Depression, unspecified: Secondary | ICD-10-CM

## 2019-06-07 MED ORDER — SERTRALINE HCL 100 MG PO TABS: 100.0000 mg | ORAL_TABLET | Freq: Every day | ORAL | 0 refills | Status: DC

## 2019-06-08 ENCOUNTER — Other Ambulatory Visit: Payer: Self-pay | Admitting: Family Medicine

## 2019-06-08 DIAGNOSIS — F32A Depression, unspecified: Secondary | ICD-10-CM

## 2019-07-11 ENCOUNTER — Other Ambulatory Visit: Payer: Self-pay | Admitting: Family Medicine

## 2019-07-11 DIAGNOSIS — F32A Depression, unspecified: Secondary | ICD-10-CM

## 2019-07-31 ENCOUNTER — Other Ambulatory Visit: Payer: Self-pay | Admitting: Family Medicine

## 2019-07-31 DIAGNOSIS — F32A Depression, unspecified: Secondary | ICD-10-CM

## 2019-08-01 ENCOUNTER — Encounter: Payer: Self-pay | Admitting: Family Medicine

## 2019-08-01 NOTE — Telephone Encounter (Signed)
Copy of results placed in provider's box.

## 2019-08-21 ENCOUNTER — Ambulatory Visit: Payer: BC Managed Care – PPO | Admitting: Family Medicine

## 2019-09-06 ENCOUNTER — Encounter: Payer: Self-pay | Admitting: Family Medicine

## 2019-09-06 ENCOUNTER — Ambulatory Visit (INDEPENDENT_AMBULATORY_CARE_PROVIDER_SITE_OTHER): Payer: Managed Care, Other (non HMO) | Admitting: Family Medicine

## 2019-09-06 VITALS — BP 124/51 | HR 97 | Ht 62.0 in | Wt 215.0 lb

## 2019-09-06 DIAGNOSIS — F329 Major depressive disorder, single episode, unspecified: Secondary | ICD-10-CM

## 2019-09-06 DIAGNOSIS — G2581 Restless legs syndrome: Secondary | ICD-10-CM

## 2019-09-06 DIAGNOSIS — K219 Gastro-esophageal reflux disease without esophagitis: Secondary | ICD-10-CM | POA: Diagnosis not present

## 2019-09-06 DIAGNOSIS — G43009 Migraine without aura, not intractable, without status migrainosus: Secondary | ICD-10-CM | POA: Diagnosis not present

## 2019-09-06 DIAGNOSIS — F32A Depression, unspecified: Secondary | ICD-10-CM

## 2019-09-06 DIAGNOSIS — J301 Allergic rhinitis due to pollen: Secondary | ICD-10-CM

## 2019-09-06 DIAGNOSIS — F339 Major depressive disorder, recurrent, unspecified: Secondary | ICD-10-CM

## 2019-09-06 DIAGNOSIS — R0789 Other chest pain: Secondary | ICD-10-CM

## 2019-09-06 MED ORDER — SERTRALINE HCL 100 MG PO TABS: 100.0000 mg | ORAL_TABLET | Freq: Every day | ORAL | 1 refills | Status: DC

## 2019-09-06 MED ORDER — ROPINIROLE HCL 0.25 MG PO TABS: 0.2500 mg | ORAL_TABLET | Freq: Every day | ORAL | 1 refills | Status: DC

## 2019-09-06 NOTE — Progress Notes (Signed)
Established Patient Office Visit  Subjective:  Patient ID: Jessica Cunningham, female    DOB: Mar 26, 1975  Age: 44 y.o. MRN: [id number]  CC:  Chief Complaint  Patient presents with  . rls    HPI Jessica Cunningham presents for   Follow-up migraine headaches, GERD, restless leg, allergic rhinitis  She does complain that she has had a little bit of tightness in her chest no cough or congestion.  No chest pain.  No lower extremity swelling.  She says it happened a couple times in the last week she thinks it might be somewhat stress-induced she has been under a lot of pressure.  He did note she has been taking a probiotic as well.  Past Medical History:  Diagnosis Date  . Allergy   . Depression   . Hyperlipidemia     Past Surgical History:  Procedure Laterality Date  . CESAREAN SECTION     x 2   . TONSILLECTOMY    . WISDOM TOOTH EXTRACTION      Family History  Problem Relation Age of Onset  . Hyperlipidemia Mother   . Hyperlipidemia Father     Social History   Socioeconomic History  . Marital status: Married    Spouse name: Not on file  . Number of children: 2   . Years of education: Not on file  . Highest education level: Not on file  Occupational History  . Not on file  Tobacco Use  . Smoking status: Former Smoker    Quit date: 01/11/2001    Years since quitting: 18.6  . Smokeless tobacco: Never Used  Substance and Sexual Activity  . Alcohol use: Yes    Alcohol/week: 0.0 - 1.0 standard drinks  . Drug use: No  . Sexual activity: Yes    Partners: Male    Birth control/protection: Other-see comments    Comment: Husband vasectomy  Other Topics Concern  . Not on file  Social History Narrative   No regular exercise. Daily caffeine.    Social Determinants of Health   Financial Resource Strain:   . Difficulty of Paying Living Expenses: Not on file  Food Insecurity:   . Worried About Programme researcher, broadcasting/film/videounning Out of Food in the Last Year: Not on file  . Ran Out of Food in the  Last Year: Not on file  Transportation Needs:   . Lack of Transportation (Medical): Not on file  . Lack of Transportation (Non-Medical): Not on file  Physical Activity:   . Days of Exercise per Week: Not on file  . Minutes of Exercise per Session: Not on file  Stress:   . Feeling of Stress : Not on file  Social Connections:   . Frequency of Communication with Friends and Family: Not on file  . Frequency of Social Gatherings with Friends and Family: Not on file  . Attends Religious Services: Not on file  . Active Member of Clubs or Organizations: Not on file  . Attends BankerClub or Organization Meetings: Not on file  . Marital Status: Not on file  Intimate Partner Violence:   . Fear of Current or Ex-Partner: Not on file  . Emotionally Abused: Not on file  . Physically Abused: Not on file  . Sexually Abused: Not on file    Outpatient Medications Prior to Visit  Medication Sig Dispense Refill  . Beclomethasone Dipropionate (QNASL) 80 MCG/ACT AERS Place 1 spray into both nostrils daily. 1 Inhaler 11  . diclofenac sodium (VOLTAREN) 1 % GEL Apply 2 g  topically 4 (four) times daily. To affected joint. 100 g 11  . esomeprazole (NEXIUM) 20 MG capsule Take 1 capsule (20 mg total) by mouth daily as needed. 90 capsule 3  . fexofenadine (ALLEGRA) 180 MG tablet Take 180 mg by mouth daily.    Marland Kitchen ipratropium (ATROVENT) 0.06 % nasal spray Place 1 spray into both nostrils 4 (four) times daily as needed. 15 mL 0  . Krill Oil 500 MG CAPS Take 500 mg by mouth daily.    . montelukast (SINGULAIR) 10 MG tablet TAKE 1 TABLET (10 MG TOTAL) BY MOUTH AT BEDTIME. 30 tablet 2  . rOPINIRole (REQUIP) 0.25 MG tablet TAKE 1-2 TABLETS (0.25-0.5 MG TOTAL) BY MOUTH AT BEDTIME. PLACE ON FILE 180 tablet 1  . sertraline (ZOLOFT) 100 MG tablet Take 1 tablet (100 mg total) by mouth daily. 15 DAY SUPPLY GIVEN. APPOINTMENT REQUIRED FOR REFILLS. 15 tablet 0   No facility-administered medications prior to visit.    Allergies   Allergen Reactions  . Paxil [Paroxetine Hcl] Other (See Comments)    Made her "crazy", hallucination  . Topamax [Topiramate]     Forgetful/not been able to complete sentences.   . Wellbutrin [Bupropion] Other (See Comments)    Hand shakes    ROS Review of Systems    Objective:    Physical Exam Constitutional:      Appearance: She is well-developed.  HENT:     Head: Normocephalic and atraumatic.     Right Ear: Tympanic membrane, ear canal and external ear normal.     Left Ear: Tympanic membrane, ear canal and external ear normal.     Nose: Nose normal.  Eyes:     Conjunctiva/sclera: Conjunctivae normal.     Pupils: Pupils are equal, round, and reactive to light.  Neck:     Thyroid: No thyromegaly.  Cardiovascular:     Rate and Rhythm: Normal rate and regular rhythm.     Heart sounds: Normal heart sounds.  Pulmonary:     Effort: Pulmonary effort is normal.     Breath sounds: Normal breath sounds. No wheezing.  Musculoskeletal:     Cervical back: Neck supple.  Lymphadenopathy:     Cervical: No cervical adenopathy.  Skin:    General: Skin is warm and dry.  Neurological:     Mental Status: She is alert and oriented to person, place, and time.     BP (!) 124/51   Pulse 97   Ht 5\' 2"  (1.575 m)   Wt 215 lb (97.5 kg)   SpO2 98%   BMI 39.32 kg/m  Wt Readings from Last 3 Encounters:  09/06/19 215 lb (97.5 kg)  08/02/18 209 lb (94.8 kg)  10/10/17 202 lb (91.6 kg)     Health Maintenance Due  Topic Date Due  . Hepatitis C Screening  Never done  . COVID-19 Vaccine (1) Never done  . INFLUENZA VACCINE  08/12/2019    There are no preventive care reminders to display for this patient.  Lab Results  Component Value Date   TSH 1.662 09/05/2014   Lab Results  Component Value Date   WBC 6.8 09/05/2014   HGB 13.2 09/05/2014   HCT 41.8 09/05/2014   MCV 86.5 09/05/2014   PLT 335 09/05/2014   Lab Results  Component Value Date   NA 140 02/06/2016   K 4.3  02/06/2016   CO2 30 02/06/2016   GLUCOSE 92 02/06/2016   BUN 14 02/06/2016   CREATININE 0.64 02/06/2016  BILITOT 0.5 02/06/2016   ALKPHOS 63 02/06/2016   AST 13 02/06/2016   ALT 16 02/06/2016   PROT 7.1 02/06/2016   ALBUMIN 4.4 02/06/2016   CALCIUM 9.2 02/06/2016   Lab Results  Component Value Date   CHOL 165 02/06/2016   Lab Results  Component Value Date   HDL 26 (L) 02/06/2016   Lab Results  Component Value Date   LDLCALC 121 (H) 02/06/2016   Lab Results  Component Value Date   TRIG 92 02/06/2016   Lab Results  Component Value Date   CHOLHDL 6.3 (H) 02/06/2016   Lab Results  Component Value Date   HGBA1C 5.1 02/06/2016      Assessment & Plan:   Problem List Items Addressed This Visit      Cardiovascular and Mediastinum   Migraine without aura, not intractable    Migraines are currently well controlled.  She just uses as needed rescue medication over-the-counter no prescriptions.  She says of the last year she is probably actually only had about 3-4.      Relevant Medications   sertraline (ZOLOFT) 100 MG tablet     Respiratory   Allergic rhinitis - Primary    She says this actually has been a pretty good year for her allergies but she still uses her Singulair as needled and sometimes nasal spray.  Usually in the fall and spring.        Digestive   Gastroesophageal reflux disease without esophagitis    Still using her Nexium regularly for allergies.        Other   RLS (restless legs syndrome)    Reports her symptoms had actually improved for a little while but started to ramp back up so she actually restarted her ropinirole she is actually back up to 2 tabs which she says seems to be working fairly well.  She does not really want to go higher on the medication though sometimes she will wake up and still feel the leg movements in the middle the night.      Major depression, recurrent, chronic (HCC)    Currently on sertraline 100 mg.  Even though  she has had a lot of increased stress recently she would like to continue with her current regimen she is not interested in adjusting the dose.  She is hopeful that things will get better.  Her PHQ-9 score was 9 today and GAD-7 score of 5.  We will make sure we get refill sent to the pharmacy did encourage her to reach out if at any point she feels like we might need to increase or adjust her dose or consider referral for therapy/counseling.      Relevant Medications   sertraline (ZOLOFT) 100 MG tablet    Other Visit Diagnoses    Acute depression       Relevant Medications   sertraline (ZOLOFT) 100 MG tablet   Chest tightness         Chest tightness-suspect more stress related she has no other concomitant symptoms to suspect viral infection etc.  It does not occur with activity or exertion.  It actually in fact has happened somewhat with rest.  Discussed symptoms to monitor for if anything changes or worsens or starts to get anterior chest pain then please let us know.  Meds ordered this encounter  Medications  . rOPINIRole (REQUIP) 0.25 MG tablet    Sig: Take 1-2 tablets (0.25-0.5 mg total) by mouth at bedtime.    Dispense:  180 tablet    Refill:  1  . sertraline (ZOLOFT) 100 MG tablet    Sig: Take 1 tablet (100 mg total) by mouth daily.    Dispense:  90 tablet    Refill:  1    Follow-up: Return in about 6 months (around 03/08/2020) for migraines and RLS. Nani Gasser, MD

## 2019-09-07 NOTE — Assessment & Plan Note (Signed)
Migraines are currently well controlled.  She just uses as needed rescue medication over-the-counter no prescriptions.  She says of the last year she is probably actually only had about 3-4.

## 2019-09-07 NOTE — Assessment & Plan Note (Signed)
Still using her Nexium regularly for allergies.

## 2019-09-07 NOTE — Assessment & Plan Note (Signed)
Currently on sertraline 100 mg.  Even though she has had a lot of increased stress recently she would like to continue with her current regimen she is not interested in adjusting the dose.  She is hopeful that things will get better.  Her PHQ-9 score was 9 today and GAD-7 score of 5.  We will make sure we get refill sent to the pharmacy did encourage her to reach out if at any point she feels like we might need to increase or adjust her dose or consider referral for therapy/counseling.

## 2019-09-07 NOTE — Assessment & Plan Note (Signed)
She says this actually has been a pretty good year for her allergies but she still uses her Singulair as needled and sometimes nasal spray.  Usually in the fall and spring.

## 2019-09-07 NOTE — Assessment & Plan Note (Signed)
Reports her symptoms had actually improved for a little while but started to ramp back up so she actually restarted her ropinirole she is actually back up to 2 tabs which she says seems to be working fairly well.  She does not really want to go higher on the medication though sometimes she will wake up and still feel the leg movements in the middle the night.

## 2019-09-13 ENCOUNTER — Encounter: Payer: Self-pay | Admitting: Family Medicine

## 2019-09-13 DIAGNOSIS — K219 Gastro-esophageal reflux disease without esophagitis: Secondary | ICD-10-CM

## 2019-09-14 MED ORDER — ESOMEPRAZOLE MAGNESIUM 20 MG PO CPDR: 20.0000 mg | DELAYED_RELEASE_CAPSULE | Freq: Every day | ORAL | 3 refills | Status: AC | PRN

## 2020-03-06 ENCOUNTER — Other Ambulatory Visit: Payer: Self-pay | Admitting: Family Medicine

## 2020-03-06 DIAGNOSIS — F32A Depression, unspecified: Secondary | ICD-10-CM

## 2020-03-06 NOTE — Telephone Encounter (Signed)
Please call pt and advise her that she is due for f/u for restless leg,migraine

## 2020-03-07 NOTE — Telephone Encounter (Signed)
Patient scheduled on 03/21/20 . AM

## 2020-03-21 ENCOUNTER — Ambulatory Visit: Payer: Managed Care, Other (non HMO) | Admitting: Family Medicine

## 2020-03-21 ENCOUNTER — Ambulatory Visit (INDEPENDENT_AMBULATORY_CARE_PROVIDER_SITE_OTHER): Payer: Managed Care, Other (non HMO)

## 2020-03-21 ENCOUNTER — Other Ambulatory Visit: Payer: Self-pay

## 2020-03-21 ENCOUNTER — Encounter: Payer: Self-pay | Admitting: Family Medicine

## 2020-03-21 VITALS — BP 130/69 | HR 96 | Ht 62.0 in | Wt 214.0 lb

## 2020-03-21 DIAGNOSIS — G43009 Migraine without aura, not intractable, without status migrainosus: Secondary | ICD-10-CM | POA: Diagnosis not present

## 2020-03-21 DIAGNOSIS — R053 Chronic cough: Secondary | ICD-10-CM | POA: Diagnosis not present

## 2020-03-21 DIAGNOSIS — F339 Major depressive disorder, recurrent, unspecified: Secondary | ICD-10-CM | POA: Diagnosis not present

## 2020-03-21 DIAGNOSIS — G2581 Restless legs syndrome: Secondary | ICD-10-CM

## 2020-03-21 MED ORDER — ELETRIPTAN HYDROBROMIDE 20 MG PO TABS
20.0000 mg | ORAL_TABLET | ORAL | 3 refills | Status: DC | PRN
Start: 2020-03-21 — End: 2021-06-01

## 2020-03-21 NOTE — Progress Notes (Signed)
Established Patient Office Visit  Subjective:  Patient ID: Jessica Cunningham, female    DOB: 04/02/1975  Age: 45 y.o. MRN: [id number]  CC:  Chief Complaint  Patient presents with  . restless leg  . Depression    HPI Chris Narasimhan presents for 82-month follow-up for  Follow-up restless leg-doing well overall.  She sometimes takes 1 pill and sometimes will take 2 pills of the Requip.  Sometimes she will skip it completely depending on how she is feeling.  Follow-up migraine headaches-overall she is doing okay.  Last week that she had a headache for most 3 days before it finally eased off.  She says usually she uses Aleve or Tylenol.  We had tried a Topamax at one point in time but felt like it was making her very forgetful.  She actually does have a headache today and feels like it is in the middle of her head.  Aches are often worse at night.  Depression -overall mood is okay she has periods every few months where she feels more down and stressed and sometimes will even not go to work because of it but usually if she is able to decompress she does feel better.  She is happy with her sertraline at 100 mg and does not want to adjust her dose today.  It can be very stressful.  Reports a chronic cough since having been exposed to COVID in December.  She had mild fatigue for about a week and then developed a dry cough that is been present ever since.  She does not feel bad.  No shortness of breath.  No fevers chills or sweats.  She has noticed some occasional wheezing but not often.   Past Medical History:  Diagnosis Date  . Allergy   . Depression   . Hyperlipidemia     Past Surgical History:  Procedure Laterality Date  . CESAREAN SECTION     x 2   . TONSILLECTOMY    . WISDOM TOOTH EXTRACTION      Family History  Problem Relation Age of Onset  . Hyperlipidemia Mother   . Hyperlipidemia Father     Social History   Socioeconomic History  . Marital status: Married    Spouse  name: Not on file  . Number of children: 2   . Years of education: Not on file  . Highest education level: Not on file  Occupational History  . Not on file  Tobacco Use  . Smoking status: Former Smoker    Quit date: 01/11/2001    Years since quitting: 19.2  . Smokeless tobacco: Never Used  Substance and Sexual Activity  . Alcohol use: Yes    Alcohol/week: 0.0 - 1.0 standard drinks  . Drug use: No  . Sexual activity: Yes    Partners: Male    Birth control/protection: Other-see comments    Comment: Husband vasectomy  Other Topics Concern  . Not on file  Social History Narrative   No regular exercise. Daily caffeine.    Social Determinants of Health   Financial Resource Strain: Not on file  Food Insecurity: Not on file  Transportation Needs: Not on file  Physical Activity: Not on file  Stress: Not on file  Social Connections: Not on file  Intimate Partner Violence: Not on file    Outpatient Medications Prior to Visit  Medication Sig Dispense Refill  . Beclomethasone Dipropionate (QNASL) 80 MCG/ACT AERS Place 1 spray into both nostrils daily. 1 Inhaler 11  .  diclofenac sodium (VOLTAREN) 1 % GEL Apply 2 g topically 4 (four) times daily. To affected joint. 100 g 11  . esomeprazole (NEXIUM) 20 MG capsule Take 1 capsule (20 mg total) by mouth daily as needed. 90 capsule 3  . fexofenadine (ALLEGRA) 180 MG tablet Take 180 mg by mouth daily.    Marland Kitchen ipratropium (ATROVENT) 0.06 % nasal spray Place 1 spray into both nostrils 4 (four) times daily as needed. 15 mL 0  . Krill Oil 500 MG CAPS Take 500 mg by mouth daily.    . montelukast (SINGULAIR) 10 MG tablet TAKE 1 TABLET (10 MG TOTAL) BY MOUTH AT BEDTIME. 30 tablet 2  . rOPINIRole (REQUIP) 0.25 MG tablet TAKE 1-2 TABLETS (0.25-0.5 MG TOTAL) BY MOUTH AT BEDTIME. 180 tablet 0  . sertraline (ZOLOFT) 100 MG tablet TAKE 1 TABLET BY MOUTH EVERY DAY 90 tablet 0   No facility-administered medications prior to visit.    Allergies  Allergen  Reactions  . Paxil [Paroxetine Hcl] Other (See Comments)    Made her "crazy", hallucination  . Topamax [Topiramate]     Forgetful/not been able to complete sentences.   . Wellbutrin [Bupropion] Other (See Comments)    Hand shakes    ROS Review of Systems    Objective:    Physical Exam Constitutional:      Appearance: She is well-developed.  HENT:     Head: Normocephalic and atraumatic.     Right Ear: External ear normal.     Left Ear: External ear normal.     Mouth/Throat:     Pharynx: No oropharyngeal exudate or posterior oropharyngeal erythema.  Eyes:     Conjunctiva/sclera: Conjunctivae normal.     Pupils: Pupils are equal, round, and reactive to light.  Neck:     Thyroid: No thyromegaly.  Cardiovascular:     Rate and Rhythm: Normal rate and regular rhythm.     Heart sounds: Normal heart sounds.  Pulmonary:     Effort: Pulmonary effort is normal.     Breath sounds: Normal breath sounds. No wheezing.  Musculoskeletal:     Cervical back: Neck supple.  Lymphadenopathy:     Cervical: No cervical adenopathy.  Skin:    General: Skin is warm and dry.  Neurological:     Mental Status: She is alert and oriented to person, place, and time.     BP 130/69   Pulse 96   Ht 5\' 2"  (1.575 m)   Wt 214 lb (97.1 kg)   LMP 02/22/2020   SpO2 97%   BMI 39.14 kg/m  Wt Readings from Last 3 Encounters:  03/21/20 214 lb (97.1 kg)  09/06/19 215 lb (97.5 kg)  08/02/18 209 lb (94.8 kg)     Health Maintenance Due  Topic Date Due  . Hepatitis C Screening  Never done  . COVID-19 Vaccine (1) Never done  . INFLUENZA VACCINE  08/12/2019  . COLONOSCOPY (Pts 45-30yrs Insurance coverage will need to be confirmed)  Never done  . PAP SMEAR-Modifier  12/17/2019    There are no preventive care reminders to display for this patient.  Lab Results  Component Value Date   TSH 1.662 09/05/2014   Lab Results  Component Value Date   WBC 6.8 09/05/2014   HGB 13.2 09/05/2014   HCT 41.8  09/05/2014   MCV 86.5 09/05/2014   PLT 335 09/05/2014   Lab Results  Component Value Date   NA 140 02/06/2016   K 4.3 02/06/2016  CO2 30 02/06/2016   GLUCOSE 92 02/06/2016   BUN 14 02/06/2016   CREATININE 0.64 02/06/2016   BILITOT 0.5 02/06/2016   ALKPHOS 63 02/06/2016   AST 13 02/06/2016   ALT 16 02/06/2016   PROT 7.1 02/06/2016   ALBUMIN 4.4 02/06/2016   CALCIUM 9.2 02/06/2016   Lab Results  Component Value Date   CHOL 174 05/22/2019   Lab Results  Component Value Date   HDL 33 (A) 05/22/2019   Lab Results  Component Value Date   LDLCALC 113 05/22/2019   Lab Results  Component Value Date   TRIG 140 05/22/2019   Lab Results  Component Value Date   CHOLHDL 6.3 (H) 02/06/2016   Lab Results  Component Value Date   HGBA1C 5.50 05/22/2019      Assessment & Plan:   Problem List Items Addressed This Visit      Cardiovascular and Mediastinum   Migraine without aura, not intractable - Primary    Discussed options.  She was willing to try something for acute treatment so we will send over prescription of Relpax for her to try.  She is not interested in taking a daily treatment at this point in time.      Relevant Medications   eletriptan (RELPAX) 20 MG tablet     Other   RLS (restless legs syndrome)    Likes being able to flex her dose on the Requip.  Will make sure refills have been sent to the pharmacy.      Major depression, recurrent, chronic (HCC)    She would like to continue her current regimen for sertraline declines any adjustments today.  Will make sure medication has refills otherwise I will see her back in 6 to 9 months.       Other Visit Diagnoses    Chronic cough       Relevant Orders   DG Chest 2 View     Cough-it sounds like she has had a postinfectious cough but it has been lasting about 3 months which is a little bit long.  We will go ahead and get chest x-ray for further work-up.  Discussed that it could also be silent reflux  triggering it especially as cough tends to trigger reflux recommend that she increase her PPI to daily for a week or 2 to see if she notices any improvement.  Also consider postnasal drip as a cause and she could also go ahead and start her spring allergy medications with either an oral antihistamine or nasal steroid spray.  If she tolerates decongestants well she can also try that as well  Meds ordered this encounter  Medications  . eletriptan (RELPAX) 20 MG tablet    Sig: Take 1 tablet (20 mg total) by mouth as needed for migraine or headache. May repeat in 2 hours if headache persists or recurs.    Dispense:  10 tablet    Refill:  3    Follow-up: Return in about 6 months (around 09/21/2020) for Migaines and Restless Leg.    Nani Gasser, MD

## 2020-03-21 NOTE — Patient Instructions (Signed)
Recommend a trial of reflux medicine daily for 10 days to see if it helps with your cough.  You may also want to consider adding an antihistamine such as Zyrtec or Allegra to see if that helps dry out any nasal secretions.  If the cough is not improving then please let me know

## 2020-03-21 NOTE — Assessment & Plan Note (Signed)
Discussed options.  She was willing to try something for acute treatment so we will send over prescription of Relpax for her to try.  She is not interested in taking a daily treatment at this point in time.

## 2020-03-21 NOTE — Assessment & Plan Note (Signed)
Likes being able to flex her dose on the Requip.  Will make sure refills have been sent to the pharmacy.

## 2020-03-21 NOTE — Assessment & Plan Note (Signed)
She would like to continue her current regimen for sertraline declines any adjustments today.  Will make sure medication has refills otherwise I will see her back in 6 to 9 months.

## 2020-07-24 ENCOUNTER — Other Ambulatory Visit: Payer: Self-pay | Admitting: Family Medicine

## 2020-07-24 DIAGNOSIS — F32A Depression, unspecified: Secondary | ICD-10-CM

## 2020-07-25 MED ORDER — SERTRALINE HCL 100 MG PO TABS: 100.0000 mg | ORAL_TABLET | Freq: Every day | ORAL | 0 refills | Status: DC

## 2020-12-01 ENCOUNTER — Other Ambulatory Visit: Payer: Self-pay | Admitting: Family Medicine

## 2020-12-01 DIAGNOSIS — F32A Depression, unspecified: Secondary | ICD-10-CM

## 2020-12-01 MED ORDER — SERTRALINE HCL 100 MG PO TABS: 100.0000 mg | ORAL_TABLET | Freq: Every day | ORAL | 0 refills | Status: DC

## 2020-12-01 MED ORDER — ROPINIROLE HCL 0.25 MG PO TABS: 0.2500 mg | ORAL_TABLET | Freq: Every day | ORAL | 0 refills | Status: DC

## 2020-12-01 NOTE — Telephone Encounter (Signed)
Pls call pt she is due for 6 mo f/u did fil meds for 90 days so should have enough into Jan.

## 2020-12-16 NOTE — Telephone Encounter (Signed)
I called, no answer, no voicemail. 

## 2021-03-08 ENCOUNTER — Other Ambulatory Visit: Payer: Self-pay | Admitting: Family Medicine

## 2021-04-09 ENCOUNTER — Other Ambulatory Visit: Payer: Self-pay | Admitting: Family Medicine

## 2021-04-10 ENCOUNTER — Other Ambulatory Visit: Payer: Self-pay | Admitting: Family Medicine

## 2021-04-10 DIAGNOSIS — F32A Depression, unspecified: Secondary | ICD-10-CM

## 2021-05-06 ENCOUNTER — Other Ambulatory Visit: Payer: Self-pay | Admitting: Family Medicine

## 2021-05-06 DIAGNOSIS — F32A Depression, unspecified: Secondary | ICD-10-CM

## 2021-05-24 ENCOUNTER — Other Ambulatory Visit: Payer: Self-pay | Admitting: Family Medicine

## 2021-05-24 DIAGNOSIS — F32A Depression, unspecified: Secondary | ICD-10-CM

## 2021-06-01 ENCOUNTER — Ambulatory Visit: Payer: Managed Care, Other (non HMO) | Admitting: Family Medicine

## 2021-06-01 ENCOUNTER — Encounter: Payer: Self-pay | Admitting: Family Medicine

## 2021-06-01 VITALS — BP 136/74 | HR 88 | Ht 62.0 in | Wt 218.0 lb

## 2021-06-01 DIAGNOSIS — F339 Major depressive disorder, recurrent, unspecified: Secondary | ICD-10-CM | POA: Diagnosis not present

## 2021-06-01 DIAGNOSIS — G43009 Migraine without aura, not intractable, without status migrainosus: Secondary | ICD-10-CM

## 2021-06-01 DIAGNOSIS — F32A Depression, unspecified: Secondary | ICD-10-CM

## 2021-06-01 DIAGNOSIS — Z13220 Encounter for screening for lipoid disorders: Secondary | ICD-10-CM

## 2021-06-01 DIAGNOSIS — R0683 Snoring: Secondary | ICD-10-CM

## 2021-06-01 DIAGNOSIS — G2581 Restless legs syndrome: Secondary | ICD-10-CM | POA: Diagnosis not present

## 2021-06-01 MED ORDER — ROPINIROLE HCL 0.25 MG PO TABS: 0.2500 mg | ORAL_TABLET | Freq: Every day | ORAL | 1 refills | Status: AC

## 2021-06-01 MED ORDER — ELETRIPTAN HYDROBROMIDE 20 MG PO TABS: 20.0000 mg | ORAL_TABLET | ORAL | 11 refills | Status: DC | PRN

## 2021-06-01 MED ORDER — SERTRALINE HCL 100 MG PO TABS: 100.0000 mg | ORAL_TABLET | Freq: Every day | ORAL | 1 refills | Status: DC

## 2021-06-01 NOTE — Assessment & Plan Note (Signed)
Stable

## 2021-06-01 NOTE — Assessment & Plan Note (Signed)
Doing well on zoloft. RF sent to pharmacy.

## 2021-06-01 NOTE — Assessment & Plan Note (Signed)
Discussed option of increasing to 1mg . She is taking 0.5mg  consistently.  She want to try starting magnesium at night first and see if helps before adjusting her dose.

## 2021-06-01 NOTE — Progress Notes (Signed)
Established Patient Office Visit  Subjective   Patient ID: Jessica Cunningham, female    DOB: November 26, 1975  Age: 46 y.o. MRN: [id number]  Chief Complaint  Patient presents with   mood   Shoulder Pain    L shoulder pain since January.    Snoring    HPI  F/U MDD -overall she is doing well but says for the last couple years every August and September until October she has noticed that she struggles more emotionally around the time that her kids get back to school.  She is not sure why she does not member any specific trauma or injury around that time.  F/U RLS -she still has a lot of movement with her restless leg even taking 0.5 mg total of ropinirole.  F/U Migraines - Stable.  Needs RF on relpax.   Is also been dealing with some left anterior shoulder pain that is been going on since January she denies any injury or trauma she says it feels like a "hollow ache".  She is not really taking any medications for it.Wakes her up at night.      ROS    Objective:     BP 136/74   Pulse 88   Ht 5\' 2"  (1.575 m)   Wt 218 lb (98.9 kg)   SpO2 96%   BMI 39.87 kg/m    Physical Exam Vitals and nursing note reviewed.  Constitutional:      Appearance: She is well-developed.  HENT:     Head: Normocephalic and atraumatic.  Cardiovascular:     Rate and Rhythm: Normal rate and regular rhythm.     Heart sounds: Normal heart sounds.  Pulmonary:     Effort: Pulmonary effort is normal.     Breath sounds: Normal breath sounds.  Musculoskeletal:     Comments: Shoulder with NROM, normal empty can test.  Strength is 5/5. Mild tenderness over and below the [facility] joint.    Skin:    General: Skin is warm and dry.  Neurological:     Mental Status: She is alert and oriented to person, place, and time.  Psychiatric:        Behavior: Behavior normal.    No results found for any visits on 06/01/21.    The 10-year ASCVD risk score (Arnett DK, et al., 2019) is: 1.7%* (Cholesterol units were  assumed)    Assessment & Plan:   Problem List Items Addressed This Visit       Cardiovascular and Mediastinum   Migraine without aura, not intractable    Stable.        Relevant Medications   sertraline (ZOLOFT) 100 MG tablet   eletriptan (RELPAX) 20 MG tablet   Other Relevant Orders   Lipid Panel w/reflex Direct LDL   COMPLETE METABOLIC PANEL WITH GFR   CBC     Other   RLS (restless legs syndrome)    Discussed option of increasing to 1mg . She is taking 0.5mg  consistently.  She want to try starting magnesium at night first and see if helps before adjusting her dose.        Relevant Medications   rOPINIRole (REQUIP) 0.25 MG tablet   Other Relevant Orders   Lipid Panel w/reflex Direct LDL   COMPLETE METABOLIC PANEL WITH GFR   CBC   Major depression, recurrent, chronic (HCC) - Primary    Doing well on zoloft. RF sent to pharmacy.         Relevant Medications   sertraline (  ZOLOFT) 100 MG tablet   Other Visit Diagnoses     Acute depression       Relevant Medications   sertraline (ZOLOFT) 100 MG tablet   Screening, lipid       Relevant Orders   Lipid Panel w/reflex Direct LDL   Snoring       Relevant Orders   Home sleep test      Snoring - Discussed option of sleep testing with home vs in-labs study.  Will order home Sleep Study    STOP BANG score of 4  Return in about 6 months (around 12/02/2021) for Migraines and Mood.    Beatrice Lecher, MD

## 2021-06-02 NOTE — Progress Notes (Signed)
Hi Mandi,  LDL cholesterol is just mildly elevated.  And your good cholesterol is low.  Just continue to work on healthy diet and regular exercise.  Your metabolic panel is okay.  Hemoglobin is a little low at 11.6 just a little borderline.  Please call the lab and see if we can add an iron panel.

## 2021-06-04 ENCOUNTER — Encounter: Payer: Self-pay | Admitting: Family Medicine

## 2021-06-04 LAB — CBC
HCT: 39.3 % (ref 35.0–45.0)
Hemoglobin: 11.6 g/dL — ABNORMAL LOW (ref 11.7–15.5)
MCH: 23.9 pg — ABNORMAL LOW (ref 27.0–33.0)
MCHC: 29.5 g/dL — ABNORMAL LOW (ref 32.0–36.0)
MCV: 80.9 fL (ref 80.0–100.0)
MPV: 10.2 fL (ref 7.5–12.5)
Platelets: 373 10*3/uL (ref 140–400)
RBC: 4.86 10*6/uL (ref 3.80–5.10)
RDW: 14.7 % (ref 11.0–15.0)
WBC: 8 10*3/uL (ref 3.8–10.8)

## 2021-06-04 LAB — COMPLETE METABOLIC PANEL WITH GFR
AG Ratio: 1.4 (calc) (ref 1.0–2.5)
ALT: 20 U/L (ref 6–29)
AST: 19 U/L (ref 10–35)
Albumin: 3.9 g/dL (ref 3.6–5.1)
Alkaline phosphatase (APISO): 61 U/L (ref 31–125)
BUN: 13 mg/dL (ref 7–25)
CO2: 26 mmol/L (ref 20–32)
Calcium: 9.1 mg/dL (ref 8.6–10.2)
Chloride: 105 mmol/L (ref 98–110)
Creat: 0.53 mg/dL (ref 0.50–0.99)
Globulin: 2.7 g/dL (calc) (ref 1.9–3.7)
Glucose, Bld: 100 mg/dL — ABNORMAL HIGH (ref 65–99)
Potassium: 5 mmol/L (ref 3.5–5.3)
Sodium: 140 mmol/L (ref 135–146)
Total Bilirubin: 0.3 mg/dL (ref 0.2–1.2)
Total Protein: 6.6 g/dL (ref 6.1–8.1)
eGFR: 115 mL/min/{1.73_m2} (ref >=60)

## 2021-06-04 LAB — LIPID PANEL W/REFLEX DIRECT LDL
Cholesterol: 157 mg/dL (ref <200)
HDL: 24 mg/dL — ABNORMAL LOW (ref >=50)
LDL Cholesterol (Calc): 110 mg/dL (calc) — ABNORMAL HIGH
Non-HDL Cholesterol (Calc): 133 mg/dL (calc) — ABNORMAL HIGH (ref <130)
Total CHOL/HDL Ratio: 6.5 (calc) — ABNORMAL HIGH (ref <5.0)
Triglycerides: 119 mg/dL (ref <150)

## 2021-06-04 LAB — IRON,TIBC AND FERRITIN PANEL
%SAT: 6 % (calc) — ABNORMAL LOW (ref 16–45)
Ferritin: 4 ng/mL — ABNORMAL LOW (ref 16–232)
Iron: 31 ug/dL — ABNORMAL LOW (ref 40–190)
TIBC: 477 mcg/dL (calc) — ABNORMAL HIGH (ref 250–450)

## 2021-06-04 NOTE — Progress Notes (Signed)
Hi Jessica Cunningham, your iron is definitely low. We had them add it on to your labs.  This could be making your restless leg worse.  Are you have heavy periods?  Or blood in your stool?  Please start taking extra iron. Recommend SlowFE over the counter once or twice a day. Then we can recheck your iron in 12 weeks to make sure you are absorbing it properly.

## 2021-06-05 ENCOUNTER — Other Ambulatory Visit: Payer: Self-pay | Admitting: Family Medicine

## 2021-09-08 LAB — HM MAMMOGRAPHY

## 2021-09-08 LAB — HM PAP SMEAR
HM Pap smear: NEGATIVE
HM Pap smear: NEGATIVE

## 2021-12-01 ENCOUNTER — Ambulatory Visit: Payer: Managed Care, Other (non HMO) | Admitting: Family Medicine

## 2021-12-01 ENCOUNTER — Encounter: Payer: Self-pay | Admitting: Family Medicine

## 2021-12-01 VITALS — BP 142/73 | HR 87 | Ht 62.0 in | Wt 221.1 lb

## 2021-12-01 DIAGNOSIS — G43009 Migraine without aura, not intractable, without status migrainosus: Secondary | ICD-10-CM

## 2021-12-01 DIAGNOSIS — F339 Major depressive disorder, recurrent, unspecified: Secondary | ICD-10-CM | POA: Diagnosis not present

## 2021-12-01 DIAGNOSIS — G2581 Restless legs syndrome: Secondary | ICD-10-CM | POA: Diagnosis not present

## 2021-12-01 DIAGNOSIS — R790 Abnormal level of blood mineral: Secondary | ICD-10-CM

## 2021-12-01 DIAGNOSIS — R519 Headache, unspecified: Secondary | ICD-10-CM

## 2021-12-01 MED ORDER — PROPRANOLOL HCL ER 60 MG PO CP24: 60.0000 mg | ORAL_CAPSULE | Freq: Every day | ORAL | 1 refills | Status: DC

## 2021-12-01 NOTE — Assessment & Plan Note (Signed)
Discussed options.  She is tried Topamax in the past and it caused forgetfulness.  We will try low-dose propranolol.  Prescription sent to pharmacy we will try this for 2 to 3 months to see if she notices decrease in chronic daily headaches as well as frequency of migraines which are occurring weekly currently.

## 2021-12-01 NOTE — Assessment & Plan Note (Signed)
Doing well.  Happy with current regimen.

## 2021-12-01 NOTE — Assessment & Plan Note (Signed)
Continue with ropinirole as needed.

## 2021-12-01 NOTE — Progress Notes (Signed)
   Established Patient Office Visit  Subjective   Patient ID: Jessica Cunningham, female    DOB: 04/12/1975  Age: 46 y.o. MRN: [id number]  Chief Complaint  Patient presents with   Follow-up    migraines and mood    HPI  Follow-up migraine headaches- she got a different generic of her birth control and unfortunately it really triggered her migraines.  Since then she has been able to switch back to her old generic and the headaches are little better but she still having at least 1 a week.  She also has chronic daily headaches which are different from the migraines.  She uses Relpax as a rescue and sometimes Tylenol or Aleve.  Follow-up major depression disorder-currently taking sertraline 100 mg.  Is like her mood is doing well overall.  With her current dosing regimen.  Restless leg syndrome-currently on ropinirole 0.25 2.5 mg at bedtime.  She says lately her restless legs actually been doing really well and she has not really had to take anything which is fantastic.    ROS    Objective:     BP (!) 142/73 (BP Location: Left Arm, Patient Position: Sitting, Cuff Size: Large)   Pulse 87   Ht 5\' 2"  (1.575 m)   Wt 221 lb 1.9 oz (100.3 kg)   SpO2 95%   BMI 40.44 kg/m    Physical Exam Vitals and nursing note reviewed.  Constitutional:      Appearance: She is well-developed.  HENT:     Head: Normocephalic and atraumatic.  Cardiovascular:     Rate and Rhythm: Normal rate and regular rhythm.     Heart sounds: Normal heart sounds.  Pulmonary:     Effort: Pulmonary effort is normal.     Breath sounds: Normal breath sounds.  Skin:    General: Skin is warm and dry.  Neurological:     Mental Status: She is alert and oriented to person, place, and time.  Psychiatric:        Behavior: Behavior normal.      No results found for any visits on 12/01/21.    The 10-year ASCVD risk score (Arnett DK, et al., 2019) is: 2.6%    Assessment & Plan:   Problem List Items Addressed  This Visit       Cardiovascular and Mediastinum   Migraine without aura, not intractable - Primary    Discussed options.  She is tried Topamax in the past and it caused forgetfulness.  We will try low-dose propranolol.  Prescription sent to pharmacy we will try this for 2 to 3 months to see if she notices decrease in chronic daily headaches as well as frequency of migraines which are occurring weekly currently.      Relevant Medications   propranolol ER (INDERAL LA) 60 MG 24 hr capsule     Other   RLS (restless legs syndrome)    Continue with ropinirole as needed.      Major depression, recurrent, chronic (HCC)    Doing well.  Happy with current regimen.       Other Visit Diagnoses     Low ferritin       Relevant Orders   Fe+TIBC+Fer   Chronic daily headache       Relevant Medications   propranolol ER (INDERAL LA) 60 MG 24 hr capsule       Return in about 3 months (around 03/03/2022) for Migraines .    03/05/2022, MD

## 2021-12-02 ENCOUNTER — Encounter: Payer: Self-pay | Admitting: Family Medicine

## 2021-12-02 LAB — IRON,TIBC AND FERRITIN PANEL
%SAT: 15 % (calc) — ABNORMAL LOW (ref 16–45)
Ferritin: 20 ng/mL (ref 16–232)
Iron: 67 ug/dL (ref 40–190)
TIBC: 448 mcg/dL (calc) (ref 250–450)

## 2021-12-02 NOTE — Progress Notes (Signed)
[facility] your ferritin is better at 20. Goal is 40 so please keep taking your iron for about 3 more months and then we can recheck again.

## 2021-12-05 ENCOUNTER — Other Ambulatory Visit: Payer: Self-pay | Admitting: Family Medicine

## 2021-12-05 DIAGNOSIS — F32A Depression, unspecified: Secondary | ICD-10-CM

## 2021-12-15 ENCOUNTER — Encounter: Payer: Self-pay | Admitting: Family Medicine

## 2021-12-15 NOTE — Progress Notes (Signed)
Negative for intraepithelial lesion or malignancy.  

## 2022-02-12 ENCOUNTER — Encounter: Payer: Self-pay | Admitting: Family Medicine

## 2022-02-12 ENCOUNTER — Ambulatory Visit: Payer: Federal, State, Local not specified - PPO | Admitting: Family Medicine

## 2022-02-12 VITALS — BP 140/75 | HR 72 | Temp 98.1°F | Ht 62.0 in | Wt 225.5 lb

## 2022-02-12 DIAGNOSIS — H8112 Benign paroxysmal vertigo, left ear: Secondary | ICD-10-CM

## 2022-02-12 MED ORDER — ONDANSETRON HCL 4 MG PO TABS: 4.0000 mg | ORAL_TABLET | Freq: Three times a day (TID) | ORAL | 0 refills | Status: DC | PRN

## 2022-02-12 MED ORDER — MECLIZINE HCL 25 MG PO TABS: 25.0000 mg | ORAL_TABLET | Freq: Three times a day (TID) | ORAL | 0 refills | Status: DC | PRN

## 2022-02-12 NOTE — Progress Notes (Signed)
Acute Office Visit  Subjective:     Patient ID: Jessica Cunningham, female    DOB: 09/14/1975, 47 y.o.   MRN: [id number]  Chief Complaint  Patient presents with   Dizziness    Starting 2 nights ago. When rolling over in bed. She also states when looking left or down. She denies nausea or vomiting. She is accompanied by her Mother.     HPI Patient is in today for dizziness when rolling over in bed, with position changes and head movement, worse on left.  Episodes last about one minute, feels like something is off. Has never happened before.   Review of Systems  Constitutional:  Negative for chills and fever.  HENT:  Negative for ear pain (crackling in left ear).   Respiratory:  Negative for shortness of breath.   Cardiovascular:  Negative for chest pain.  Gastrointestinal:  Negative for nausea and vomiting.  Neurological:  Positive for dizziness (last for about one minute). Negative for weakness.        Objective:    BP (!) 140/75   Pulse 72   Temp 98.1 F (36.7 C) (Oral)   Ht 5\' 2"  (1.575 m)   Wt 225 lb 8 oz (102.3 kg)   SpO2 95%   BMI 41.24 kg/m  BP Readings from Last 3 Encounters:  02/12/22 (!) 140/75  12/01/21 (!) 142/73  06/01/21 136/74      Physical Exam Vitals and nursing note reviewed.  Constitutional:      Appearance: Normal appearance. She is obese.  Eyes:     General: No visual field deficit. Pulmonary:     Effort: Pulmonary effort is normal.  Musculoskeletal:        General: Normal range of motion.  Skin:    General: Skin is warm and dry.     Capillary Refill: Capillary refill takes less than 2 seconds.  Neurological:     General: No focal deficit present.     Mental Status: She is alert and oriented to person, place, and time. Mental status is at baseline.     GCS: GCS eye subscore is 4. GCS verbal subscore is 5. GCS motor subscore is 6.     Cranial Nerves: No cranial nerve deficit or facial asymmetry.     Sensory: Sensation is intact.      Motor: Motor function is intact. No weakness.     Coordination: Romberg sign negative. Coordination normal. Finger-Nose-Finger Test and Heel to [facility] Test normal.     Gait: Gait is intact.     Comments: Alert, communicative with normal speech. PERRLA, vision grossly intact. EOM normal, positive nystagmus on left. Smile symmetric. Uvula midline, swallowing intact, tongue midline and able to move side to side. Clinches jaw, puffs cheeks, closes eyes tightly, raises eyebrows. Hearing grossly intact. Moves head side to side against resistance. Shrugs shoulders against resistance.  5/5 upper and lower extremity strength. Ambulates with coordinated gait. Finger to nose normal. Rapid alternating hands normal. Heel to shin normal. Romberg negative.  Positive Dix-Hallpike on left.   Psychiatric:        Mood and Affect: Mood normal.        Behavior: Behavior normal.        Thought Content: Thought content normal.        Judgment: Judgment normal.     No results found for any visits on 02/12/22.      Assessment & Plan:   Problem List Items Addressed This Visit  BPPV (benign paroxysmal positional vertigo), left - Primary    Reports "dizziness "with position changes, worse on left.  Symptoms have been present for 2 days.  Denies nausea or vomiting.  Denies slurred speech, weakness or obvious neurological changes.  Neurological exam normal in office.  Positive Dix-Hallpike on the left.  Symptoms were severe during test.  She recovered quickly and was able to get off the exam table without problem.  She is present today with her mother, encouraged her to stay with her mother today and gave signs and symptoms and things to look out for that would warrant seeking a higher level of care.  She will start meclizine 25 mg up to 3 times per day as needed for dizziness.  She denies nausea, Zofran 4 mg given as a precaution.  Information provided on the Epley maneuver.      Relevant Medications    meclizine (ANTIVERT) 25 MG tablet   ondansetron (ZOFRAN) 4 MG tablet    Meds ordered this encounter  Medications   meclizine (ANTIVERT) 25 MG tablet    Sig: Take 1 tablet (25 mg total) by mouth 3 (three) times daily as needed for dizziness.    Dispense:  30 tablet    Refill:  0    Order Specific Question:   Supervising Provider    Answer:   Leeanne Rio [[id number]]   ondansetron (ZOFRAN) 4 MG tablet    Sig: Take 1 tablet (4 mg total) by mouth every 8 (eight) hours as needed for nausea or vomiting.    Dispense:  20 tablet    Refill:  0    Order Specific Question:   Supervising Provider    Answer:   Leeanne Rio [[id number]]    Return if symptoms worsen or fail to improve.  Chalmers Guest, FNP

## 2022-02-12 NOTE — Assessment & Plan Note (Addendum)
Reports "dizziness "with position changes, worse on left.  Symptoms have been present for 2 days.  Denies nausea or vomiting.  Denies slurred speech, weakness or obvious neurological changes.  Neurological exam normal in office.  Positive Dix-Hallpike on the left.  Symptoms were severe during test.  She recovered quickly and was able to get off the exam table without problem.  She is present today with her mother, encouraged her to stay with her mother today and gave signs and symptoms and things to look out for that would warrant seeking a higher level of care.  She will start meclizine 25 mg up to 3 times per day as needed for dizziness.  She denies nausea, Zofran 4 mg given as a precaution.  Information provided on the Epley maneuver.

## 2022-02-16 ENCOUNTER — Encounter: Payer: Self-pay | Admitting: Family Medicine

## 2022-02-17 ENCOUNTER — Other Ambulatory Visit: Payer: Self-pay | Admitting: Family Medicine

## 2022-02-17 ENCOUNTER — Encounter: Payer: Self-pay | Admitting: Family Medicine

## 2022-02-17 DIAGNOSIS — H8112 Benign paroxysmal vertigo, left ear: Secondary | ICD-10-CM

## 2022-02-19 ENCOUNTER — Other Ambulatory Visit: Payer: Self-pay

## 2022-02-19 ENCOUNTER — Encounter: Payer: Self-pay | Admitting: Rehabilitative and Restorative Service Providers"

## 2022-02-19 ENCOUNTER — Ambulatory Visit
Payer: Federal, State, Local not specified - PPO | Attending: Family Medicine | Admitting: Rehabilitative and Restorative Service Providers"

## 2022-02-19 DIAGNOSIS — R42 Dizziness and giddiness: Secondary | ICD-10-CM | POA: Diagnosis not present

## 2022-02-19 DIAGNOSIS — H8112 Benign paroxysmal vertigo, left ear: Secondary | ICD-10-CM | POA: Diagnosis not present

## 2022-02-19 NOTE — Therapy (Signed)
OUTPATIENT PHYSICAL THERAPY VESTIBULAR EVALUATION   Patient Name: Jessica Cunningham MRN: [id number] DOB:10-Oct-1975, 47 y.o., female Today's Date: 02/19/2022  END OF SESSION:  PT End of Session - 02/19/22 0948     Visit Number 1    Number of Visits 8    Date for PT Re-Evaluation 03/21/22    Authorization Type BC[employer]    PT Start Time 0940    PT Stop Time 1019    PT Time Calculation (min) 39 min    Activity Tolerance Patient tolerated treatment well    Behavior During Therapy [facility] for tasks assessed/performed            Past Medical History:  Diagnosis Date   Allergy    Depression    Hyperlipidemia    Past Surgical History:  Procedure Laterality Date   CESAREAN SECTION     x 2    TONSILLECTOMY     WISDOM TOOTH EXTRACTION     Patient Active Problem List   Diagnosis Date Noted   BPPV (benign paroxysmal positional vertigo), left 02/12/2022   Gastroesophageal reflux disease without esophagitis 04/21/2015   RLS (restless legs syndrome) 04/21/2015   Back pain 10/14/2014   Bilateral hand numbness 10/04/2014   Cervical radiculopathy 10/04/2014   Migraine without aura, not intractable 09/02/2014   Major depression, recurrent, chronic ([place]) 10/04/2008   Allergic rhinitis 10/04/2008   PCP: Beatrice Lecher, MD REFERRING PROVIDER: Pecolia Ades, NP REFERRING DIAG: [phone] (ICD-10-CM) - BPPV (benign paroxysmal positional vertigo), left  THERAPY DIAG:  BPPV (benign paroxysmal positional vertigo), left  Dizziness and giddiness  ONSET DATE: 02/11/22  Rationale for Evaluation and Treatment: Rehabilitation  SUBJECTIVE:   SUBJECTIVE STATEMENT: The patient began with room spinning dizziness on 02/11/22. After seeing MD, she tried Epley's at home and felt some symptoms improved slightly, but she does have symptoms on the R now too. She also notes a sensation of visual blurring with head motion, and imbalance. She notes she has not been able to drive this week due to symptoms and this is  impacting work activities (she does home visits). Pt accompanied by: self  PERTINENT HISTORY: h/o migraine  PAIN:  Are you having pain? No  PRECAUTIONS: None  WEIGHT BEARING RESTRICTIONS: No  FALLS: Has patient fallen in last 6 months? No  LIVING ENVIRONMENT: Lives with: lives with their family Lives in: House/apartment Occupation: Adult Publishing rights manager.  PLOF: Independent  PATIENT GOALS: "feel a little better"  OBJECTIVE:   POSTURE:  rounded shoulders and forward head  Cervical ROM:  "I carry my stress in neck"; can be sore  Active A/PROM (deg) eval  Flexion WFLs  Extension   Right lateral flexion   Left lateral flexion   Right rotation   Left rotation   (Blank rows = not tested)  PATIENT SURVEYS:  FOTO 43%  VESTIBULAR ASSESSMENT:  GENERAL OBSERVATION: Ambulates into clinic independently   SYMPTOM BEHAVIOR:  Subjective history: Sudden onset of vertigo beginning 02/11/22  Non-Vestibular symptoms:  none  Type of dizziness: Spinning/Vertigo, Unsteady with head/body turns, and Lightheadedness/Faint  Frequency: daily  Duration: seconds to minutes  Aggravating factors: Induced by position change: rolling to the right and rolling to the left, Induced by motion: looking up at the ceiling, bending down to the ground, turning body quickly, and turning head quickly, and Worse in the morning  Relieving factors: head stationary  Progression of symptoms: better  OCULOMOTOR EXAM:  Ocular Alignment: normal  Ocular ROM: No Limitations  Spontaneous Nystagmus:  absent  Gaze-Induced Nystagmus: absent  Smooth Pursuits: intact  Saccades: intact   VESTIBULAR - OCULAR REFLEX:   Slow VOR: Comment: slow VOR x 3 reps provokes a 5/10 sensation of dizziness, described as blurry and brings on spinning  VOR Cancellation: Normal  provokes sensation of dizziness  Head-Impulse Test: HIT Right: positive HIT Left: positive demonstrates dec'd use of VOR    POSITIONAL  TESTING: Right Dix-Hallpike: downbeating, left nystagmus x 15 seconds indicating L anterior canalithiasis Left Dix-Hallpike: no nystagmus Right Roll Test: apogeotropic nystagmus Left Roll Test: apogeotropic nystagmus *apogeotropic nystagmus are 3-5 beats after rolling and not associated with symptoms of spinning. Will re-assess next session   VESTIBULAR TREATMENT:                                                                                                   02/19/22  Canalith Repositioning:  Epley Right: Number of Reps: 1 Gaze Adaptation:  x1 Viewing Horizontal: Position: seated, Time: 20 seconds, Reps: 1, and Comment: tolerated well  PATIENT EDUCATION: Education details: nature of condition, HEP Person educated: Patient Education method: Explanation, Demonstration, and Handouts Education comprehension: verbalized understanding and returned demonstration  HOME EXERCISE PROGRAM:  Access Code: [email or web address] URL: [email or web address] Date: 02/19/2022 Prepared by: Rudell Cobb  Exercises - Seated Gaze Stabilization with Head Rotation  - 2 x daily - 7 x weekly - 1 sets - 1 reps - 20-30 seconds hold  GOALS: Goals reviewed with patient? Yes  LONG TERM GOALS: Target date: 03/19/2022   Patient will report compliance with initial HEP.  Baseline: initiated at eval Goal status: INITIAL  2.  Patient will report resolution of dizziness with looking up, bending over, and quick head turns.  Baseline: Notes sensation of dizziness with movement and guarding through neck. Goal status: INITIAL  3. The patient will improve functional status score from 43% up to 61%.  Goal status: INITIAL  4.  The patient will return to driving and social activities without limitations. Baseline: Not driving Goal status: INITIAL  5. The patient will have negative positional testing.  Baseline: + R dix hallpike for L anterior canal BPPV and possible horizontal canal involvement (to further  assess)  Goal status: INITIAL  ASSESSMENT:  CLINICAL IMPRESSION: Patient is a 47 y.o. female who was seen today for physical therapy evaluation and treatment for BPPV and dizziness. She presents with positional testing indicative of L anterior canal BPPV (may have converted from posterior canal based on her reports of prior symptoms), possible horizontal canal involvement. In addition, she has + head impulse testing indicating dec'd use of vestibular ocular reflex and imbalance with walking. PT to progress to return to prior functional status.   OBJECTIVE IMPAIRMENTS: Abnormal gait, decreased activity tolerance, decreased balance, dizziness, and impaired vision/preception.   ACTIVITY LIMITATIONS: bending, sleeping, and locomotion level  PARTICIPATION LIMITATIONS: cleaning, driving, and occupation  PERSONAL FACTORS: 1 comorbidity: h/o migraines  are also affecting patient's functional outcome.   REHAB POTENTIAL: Good  CLINICAL DECISION MAKING: Stable/uncomplicated  EVALUATION COMPLEXITY: Low   PLAN:  PT FREQUENCY: 1-2x/week  PT DURATION: 4 weeks  PLANNED INTERVENTIONS: Therapeutic exercises, Therapeutic activity, Neuromuscular re-education, Balance training, Gait training, Patient/Family education, Self Care, Vestibular training, Canalith repositioning, and Manual therapy  PLAN FOR NEXT SESSION: Plan to reassess and treat positional vertigo. Progress to add habituation, gaze adaptation, and balance activities to HEP   Dex Blakely, PT 02/19/2022, 12:58 PM

## 2022-02-25 ENCOUNTER — Ambulatory Visit: Payer: Federal, State, Local not specified - PPO

## 2022-02-25 DIAGNOSIS — R42 Dizziness and giddiness: Secondary | ICD-10-CM | POA: Diagnosis not present

## 2022-02-25 DIAGNOSIS — H8112 Benign paroxysmal vertigo, left ear: Secondary | ICD-10-CM

## 2022-02-25 NOTE — Therapy (Signed)
OUTPATIENT PHYSICAL THERAPY VESTIBULAR TREATMENT   Patient Name: Jessica Cunningham MRN: [id number] DOB:Apr 12, 1975, 47 y.o., female Today's Date: 02/25/2022  END OF SESSION:  PT End of Session - 02/25/22 0851     Visit Number 2    Number of Visits 8    Date for PT Re-Evaluation 03/21/22    Authorization Type BCBS    PT Start Time 0850    PT Stop Time 0930    PT Time Calculation (min) 40 min            Past Medical History:  Diagnosis Date   Allergy    Depression    Hyperlipidemia    Past Surgical History:  Procedure Laterality Date   CESAREAN SECTION     x 2    TONSILLECTOMY     WISDOM TOOTH EXTRACTION     Patient Active Problem List   Diagnosis Date Noted   BPPV (benign paroxysmal positional vertigo), left 02/12/2022   Gastroesophageal reflux disease without esophagitis 04/21/2015   RLS (restless legs syndrome) 04/21/2015   Back pain 10/14/2014   Bilateral hand numbness 10/04/2014   Cervical radiculopathy 10/04/2014   Migraine without aura, not intractable 09/02/2014   Major depression, recurrent, chronic ([place]) 10/04/2008   Allergic rhinitis 10/04/2008   PCP: Beatrice Lecher, MD REFERRING PROVIDER: Pecolia Ades, NP REFERRING DIAG: [phone] (ICD-10-CM) - BPPV (benign paroxysmal positional vertigo), left  THERAPY DIAG:  BPPV (benign paroxysmal positional vertigo), left  Dizziness and giddiness  ONSET DATE: 02/11/22  Rationale for Evaluation and Treatment: Rehabilitation  SUBJECTIVE:   SUBJECTIVE STATEMENT: Patient reports she felt she felt her symptoms increase again after Friday and performed an at-home Epley on Wednesday (states she was feeling like she was spinning). Patient states she felt better but symptoms did not go away completely. Patient states she has been taking it easy this morning; states she noticed this morning she was leaning/losing balance towards L side, however as morning progressed equilibrium has improved.  Pt accompanied by:  self  PERTINENT HISTORY: h/o migraine  PAIN:  Are you having pain? No  PRECAUTIONS: None  WEIGHT BEARING RESTRICTIONS: No  FALLS: Has patient fallen in last 6 months? No  LIVING ENVIRONMENT: Lives with: lives with their family Lives in: House/apartment Occupation: Adult Publishing rights manager.  PLOF: Independent  PATIENT GOALS: "feel a little better"  OBJECTIVE:   POSTURE:  rounded shoulders and forward head  Cervical ROM:  "I carry my stress in neck"; can be sore  Active A/PROM (deg) eval  Flexion WFLs  Extension   Right lateral flexion   Left lateral flexion   Right rotation   Left rotation   (Blank rows = not tested)  PATIENT SURVEYS:  FOTO 43%  VESTIBULAR ASSESSMENT:  GENERAL OBSERVATION: Ambulates into clinic independently   SYMPTOM BEHAVIOR:  Subjective history: Sudden onset of vertigo beginning 02/11/22  Non-Vestibular symptoms:  none  Type of dizziness: Spinning/Vertigo, Unsteady with head/body turns, and Lightheadedness/Faint  Frequency: daily  Duration: seconds to minutes  Aggravating factors: Induced by position change: rolling to the right and rolling to the left, Induced by motion: looking up at the ceiling, bending down to the ground, turning body quickly, and turning head quickly, and Worse in the morning  Relieving factors: head stationary  Progression of symptoms: better  OCULOMOTOR EXAM:  Ocular Alignment: normal  Ocular ROM: No Limitations  Spontaneous Nystagmus: absent  Gaze-Induced Nystagmus: absent  Smooth Pursuits: intact  Saccades: intact   VESTIBULAR - OCULAR REFLEX:  Slow VOR: Comment: slow VOR x 3 reps provokes a 5/10 sensation of dizziness, described as blurry and brings on spinning  VOR Cancellation: Normal  provokes sensation of dizziness  Head-Impulse Test: HIT Right: positive HIT Left: positive demonstrates dec'd use of VOR    POSITIONAL TESTING: Right Dix-Hallpike: downbeating, left nystagmus x 15 seconds  indicating L anterior canalithiasis Left Dix-Hallpike: no nystagmus Right Roll Test: apogeotropic nystagmus Left Roll Test: apogeotropic nystagmus *apogeotropic nystagmus are 3-5 beats after rolling and not associated with symptoms of spinning. Will re-assess next session   VESTIBULAR TREATMENT:    [facility] Adult PT Treatment:                                                DATE: 02/25/2022 Neuromuscular re-ed: Dix-Hallpike R (-) & L (-) Supine Roll Test R (-) & L (-) Seated: Repeated forward bending <--> upright sitting: x5, x6, x10, x7 VORx1 yaw plane: metronome @ 50bpm, 5x30"                                                                                                   02/19/22 Canalith Repositioning:  Epley Right: Number of Reps: 1 Gaze Adaptation:  x1 Viewing Horizontal: Position: seated, Time: 20 seconds, Reps: 1, and Comment: tolerated well  PATIENT EDUCATION: Education details: nature of condition, HEP Person educated: Patient Education method: Explanation, Demonstration, and Handouts Education comprehension: verbalized understanding and returned demonstration  HOME EXERCISE PROGRAM:  Access Code: [email or web address] URL: [email or web address] Date: 02/19/2022 Prepared by: Rudell Cobb  Exercises - Seated Gaze Stabilization with Head Rotation  - 2 x daily - 7 x weekly - 1 sets - 1 reps - 20-30 seconds hold  GOALS: Goals reviewed with patient? Yes  LONG TERM GOALS: Target date: 03/19/2022   Patient will report compliance with initial HEP.  Baseline: initiated at eval Goal status: INITIAL  2.  Patient will report resolution of dizziness with looking up, bending over, and quick head turns.  Baseline: Notes sensation of dizziness with movement and guarding through neck. Goal status: INITIAL  3. The patient will improve functional status score from 43% up to 61%.  Goal status: INITIAL  4.  The patient will return to driving and social activities without  limitations. Baseline: Not driving Goal status: INITIAL  5. The patient will have negative positional testing.  Baseline: + R dix hallpike for L anterior canal BPPV and possible horizontal canal involvement (to further assess)  Goal status: INITIAL  ASSESSMENT:  CLINICAL IMPRESSION:  Dix-Hallpike and supine roll test performed to re-check BPPV treatment from previous session; bilateral negative findings in posterior and horizontal canals. Habituation of repeated forward bending in seated position progressed slightly with increased repetitions; patient able to tolerate 4 rounds of activity before discontinuing due to exacerbation of symptoms. Moderate increase in symptoms during VORx1 with head turns in yaw plane during 3rd round of exercise, however symptoms decreased on 4th and 5th round.   OBJECTIVE  IMPAIRMENTS: Abnormal gait, decreased activity tolerance, decreased balance, dizziness, and impaired vision/preception.   ACTIVITY LIMITATIONS: bending, sleeping, and locomotion level  PARTICIPATION LIMITATIONS: cleaning, driving, and occupation  PERSONAL FACTORS: 1 comorbidity: h/o migraines  are also affecting patient's functional outcome.   REHAB POTENTIAL: Good  CLINICAL DECISION MAKING: Stable/uncomplicated  EVALUATION COMPLEXITY: Low   PLAN:  PT FREQUENCY: 1-2x/week  PT DURATION: 4 weeks  PLANNED INTERVENTIONS: Therapeutic exercises, Therapeutic activity, Neuromuscular re-education, Balance training, Gait training, Patient/Family education, Self Care, Vestibular training, Canalith repositioning, and Manual therapy  PLAN FOR NEXT SESSION: Progress habituation and VORx1 as tolerated; Progress gaze adaptation and balance activities to HEP. Re-assess BPPV as needed.    Hardin Negus, PTA 02/25/2022, 9:33 AM

## 2022-03-04 ENCOUNTER — Encounter: Payer: Self-pay | Admitting: Family Medicine

## 2022-03-04 ENCOUNTER — Ambulatory Visit: Payer: Federal, State, Local not specified - PPO

## 2022-03-04 ENCOUNTER — Ambulatory Visit: Payer: Federal, State, Local not specified - PPO | Admitting: Family Medicine

## 2022-03-04 VITALS — BP 136/69 | HR 74 | Ht 62.0 in | Wt 227.0 lb

## 2022-03-04 DIAGNOSIS — H8112 Benign paroxysmal vertigo, left ear: Secondary | ICD-10-CM | POA: Diagnosis not present

## 2022-03-04 DIAGNOSIS — G44209 Tension-type headache, unspecified, not intractable: Secondary | ICD-10-CM | POA: Diagnosis not present

## 2022-03-04 DIAGNOSIS — R42 Dizziness and giddiness: Secondary | ICD-10-CM

## 2022-03-04 DIAGNOSIS — F339 Major depressive disorder, recurrent, unspecified: Secondary | ICD-10-CM

## 2022-03-04 DIAGNOSIS — M9902 Segmental and somatic dysfunction of thoracic region: Secondary | ICD-10-CM | POA: Diagnosis not present

## 2022-03-04 DIAGNOSIS — M542 Cervicalgia: Secondary | ICD-10-CM | POA: Diagnosis not present

## 2022-03-04 DIAGNOSIS — R790 Abnormal level of blood mineral: Secondary | ICD-10-CM

## 2022-03-04 DIAGNOSIS — M9901 Segmental and somatic dysfunction of cervical region: Secondary | ICD-10-CM | POA: Diagnosis not present

## 2022-03-04 DIAGNOSIS — G43009 Migraine without aura, not intractable, without status migrainosus: Secondary | ICD-10-CM | POA: Diagnosis not present

## 2022-03-04 DIAGNOSIS — J310 Chronic rhinitis: Secondary | ICD-10-CM

## 2022-03-04 DIAGNOSIS — J301 Allergic rhinitis due to pollen: Secondary | ICD-10-CM

## 2022-03-04 DIAGNOSIS — M722 Plantar fascial fibromatosis: Secondary | ICD-10-CM

## 2022-03-04 MED ORDER — IPRATROPIUM BROMIDE 0.06 % NA SOLN: 1.0000 | Freq: Four times a day (QID) | NASAL | 99 refills | Status: AC | PRN

## 2022-03-04 NOTE — Assessment & Plan Note (Signed)
Refill sent to pharmacy for Atrovent nasal spray.

## 2022-03-04 NOTE — Assessment & Plan Note (Signed)
She has been going to vestibular rehab she has had 3 sessions thus far but unfortunately is still struggling with the vertigo.  If not continuing to improve in the next couple of weeks then please let us know.

## 2022-03-04 NOTE — Therapy (Signed)
OUTPATIENT PHYSICAL THERAPY VESTIBULAR TREATMENT   Patient Name: Jessica Cunningham MRN: [id number] DOB:[birth year], 47 y.o., female Today's Date: 03/04/2022  END OF SESSION:  PT End of Session - 03/04/22 0848     Visit Number 3    Number of Visits 8    Date for PT Re-Evaluation 03/21/22    Authorization Type BCBS    PT Start Time 0848    PT Stop Time 0928    PT Time Calculation (min) 40 min    Activity Tolerance Patient tolerated treatment well    Behavior During Therapy [facility] for tasks assessed/performed            Past Medical History:  Diagnosis Date   Allergy    Depression    Hyperlipidemia    Past Surgical History:  Procedure Laterality Date   CESAREAN SECTION     x 2    TONSILLECTOMY     WISDOM TOOTH EXTRACTION     Patient Active Problem List   Diagnosis Date Noted   BPPV (benign paroxysmal positional vertigo), left 02/12/2022   Gastroesophageal reflux disease without esophagitis 04/21/2015   RLS (restless legs syndrome) 04/21/2015   Back pain 10/14/2014   Bilateral hand numbness 10/04/2014   Cervical radiculopathy 10/04/2014   Migraine without aura, not intractable 09/02/2014   Major depression, recurrent, chronic ([place]) 10/04/2008   Allergic rhinitis 10/04/2008   PCP: Beatrice Lecher, MD REFERRING PROVIDER: Pecolia Ades, NP REFERRING DIAG: [phone] (ICD-10-CM) - BPPV (benign paroxysmal positional vertigo), left  THERAPY DIAG:  BPPV (benign paroxysmal positional vertigo), left  Dizziness and giddiness  ONSET DATE: 02/11/22  Rationale for Evaluation and Treatment: Rehabilitation  SUBJECTIVE:   SUBJECTIVE STATEMENT: Patient reports last night when she would tilt her head to the right she would get dizzy, states she repetitively bent her head and symptoms decreased slightly. Patient states her symptoms have decreased but continue to come and go. 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   PERTINENT  HISTORY: h/o migraine  PAIN:  Are you having pain? No  PRECAUTIONS: None  WEIGHT BEARING RESTRICTIONS: No  FALLS: Has patient fallen in last 6 months? No  LIVING ENVIRONMENT: Lives with: lives with their family Lives in: House/apartment Occupation: Adult Publishing rights manager.  PLOF: Independent  PATIENT GOALS: "feel a little better"  OBJECTIVE:   POSTURE:  rounded shoulders and forward head  Cervical ROM:  "I carry my stress in neck"; can be sore  Active A/PROM (deg) eval  Flexion WFLs  Extension   Right lateral flexion   Left lateral flexion   Right rotation   Left rotation   (Blank rows = not tested)  PATIENT SURVEYS:  FOTO 43%  VESTIBULAR ASSESSMENT:  GENERAL OBSERVATION: Ambulates into clinic independently   SYMPTOM BEHAVIOR:  Subjective history: Sudden onset of vertigo beginning 02/11/22  Non-Vestibular symptoms:  none  Type of dizziness: Spinning/Vertigo, Unsteady with head/body turns, and Lightheadedness/Faint  Frequency: daily  Duration: seconds to minutes  Aggravating factors: Induced by position change: rolling to the right and rolling to the left, Induced by motion: looking up at the ceiling, bending down to the ground, turning body quickly, and turning head quickly, and Worse in the morning  Relieving factors: head stationary  Progression of symptoms: better  OCULOMOTOR EXAM:  Ocular Alignment: normal  Ocular ROM: No Limitations  Spontaneous Nystagmus: absent  Gaze-Induced Nystagmus: absent  Smooth Pursuits: intact  Saccades: intact   VESTIBULAR - OCULAR REFLEX:  Slow VOR: Comment: slow VOR x 3 reps provokes a 5/10 sensation of dizziness, described as blurry and brings on spinning  VOR Cancellation: Normal  provokes sensation of dizziness  Head-Impulse Test: HIT Right: positive HIT Left: positive demonstrates dec'd use of VOR    POSITIONAL TESTING: Right Dix-Hallpike: downbeating, left nystagmus x 15 seconds indicating L  anterior canalithiasis Left Dix-Hallpike: no nystagmus Right Roll Test: apogeotropic nystagmus Left Roll Test: apogeotropic nystagmus *apogeotropic nystagmus are 3-5 beats after rolling and not associated with symptoms of spinning. Will re-assess next session   VESTIBULAR TREATMENT:    [facility] Adult PT Treatment:                                                DATE: 03/04/2022 Neuromuscular re-ed: Brandt-Daroff  Seated: VORx1 yaw plane: metronome @ 60bpm 3x30" VORc Y/P plane: metronome @ 40bpm x30" --> 60bpm 3x30" Standing VORc --> walking VORc yaw plane 4L Standing VORx1 yaw plane    [facility] Adult PT Treatment:                                                DATE: 02/25/2022 Neuromuscular re-ed: Dix-Hallpike R (-) & L (-) Supine Roll Test R (-) & L (-) Seated: Repeated forward bending <--> upright sitting: x5, x6, x10, x7 VORx1 yaw plane: metronome @ 50bpm, 5x30"                                                                                                   02/19/22 Canalith Repositioning:  Epley Right: Number of Reps: 1 Gaze Adaptation:  x1 Viewing Horizontal: Position: seated, Time: 20 seconds, Reps: 1, and Comment: tolerated well  PATIENT EDUCATION: Education details: Optician, dispensing Person educated: Patient Education method: Explanation, Demonstration, and Handouts Education comprehension: verbalized understanding and returned demonstration  HOME EXERCISE PROGRAM:  Access Code: [email or web address] URL: [email or web address] Date: 03/04/2022 Prepared by: Helane Gunther  Exercises - Seated Gaze Stabilization with Head Rotation  - 2 x daily - 7 x weekly - 1 sets - 1 reps - 20-30 seconds hold - Seated to Fold Over Vestibular Habituation  - 2 x daily - 7 x weekly - 5 sets - 7-10 reps - 5 sec hold - Brandt-Daroff Vestibular Exercise  - 1 x daily - 7 x weekly - 3 sets - 10 reps - Walking Gaze Stabilization Head Rotation  - 1 x daily - 7 x weekly - 3 sets - 10 reps -  Forward Walking Horizontal VOR Cancellation  - 1 x daily - 7 x weekly - 3 sets - 10 reps  GOALS: Goals reviewed with patient? Yes  LONG TERM GOALS: Target date: 03/19/2022   Patient will report compliance with initial HEP.  Baseline: initiated at eval Goal status: INITIAL  2.  Patient will report resolution of dizziness with looking  up, bending over, and quick head turns.  Baseline: Notes sensation of dizziness with movement and guarding through neck. Goal status: INITIAL  3. The patient will improve functional status score from 43% up to 61%.  Goal status: INITIAL  4.  The patient will return to driving and social activities without limitations. Baseline: Not driving Goal status: INITIAL  5. The patient will have negative positional testing.  Baseline: + R dix hallpike for L anterior canal BPPV and possible horizontal canal involvement (to further assess)  Goal status: INITIAL  ASSESSMENT:  CLINICAL IMPRESSION:  Patient completed Brandt-Daroff exercises with instructions and cueing; handout provided with addition to HEP. Moderate increase in symptoms reported at halfway point during Brandt-Daroff, more so when sitting up from L side lying; symptoms gradually decreased by last round. Moderate instability exhibited during walking VORc, however over time stability improved and patient able to tolerate slight progression in speed. Mild increase in symptoms noted after VORc, with symptoms decreasing after completing standing VORx1 in yaw plane.   OBJECTIVE IMPAIRMENTS: Abnormal gait, decreased activity tolerance, decreased balance, dizziness, and impaired vision/preception.   ACTIVITY LIMITATIONS: bending, sleeping, and locomotion level  PARTICIPATION LIMITATIONS: cleaning, driving, and occupation  PERSONAL FACTORS: 1 comorbidity: h/o migraines  are also affecting patient's functional outcome.   REHAB POTENTIAL: Good  CLINICAL DECISION MAKING: Stable/uncomplicated  EVALUATION  COMPLEXITY: Low   PLAN:  PT FREQUENCY: 1-2x/week  PT DURATION: 4 weeks  PLANNED INTERVENTIONS: Therapeutic exercises, Therapeutic activity, Neuromuscular re-education, Balance training, Gait training, Patient/Family education, Self Care, Vestibular training, Canalith repositioning, and Manual therapy  PLAN FOR NEXT SESSION: Follow up on Brandt-Daroff . Progress habituation and VORx1/VORc as tolerated. Progress gaze adaptation and balance activities with HEP.    Hardin Negus, PTA 03/04/2022, 9:30 AM

## 2022-03-04 NOTE — Assessment & Plan Note (Signed)
She is doing really well with propranolol thus far it has really helped that pressure sensation that she was getting in her head and was daily in fact she is rarely had to use her rescue medication which is fantastic.  Only thing that she has noticed is that she is gained some weight over the last several months.

## 2022-03-04 NOTE — Progress Notes (Signed)
Established Patient Office Visit  Subjective   Patient ID: Jessica Cunningham, female    DOB: [birth year]  Age: 47 y.o. MRN: [id number]  Chief Complaint  Patient presents with   Migraine    HPI F/U Migraines -doing well so far with propranolol.  It is helped reduce that pressure sensation in her head.  She has rarely had to use her rescue medication.  She was seen recently for vertigo by one of my partners.  She was referred to vestibular rehab and is gone for 3 sessions thus far.  Due to recheck iron levels.  Last checked in November and they were improving.  Plans on trying to get back into exercise using the pool.  She unfortunately has been struggling with bilateral plantar fasciitis and it has been keeping her from exercising.  She just recently had steroid injections with Dr. Raelyn Ensign.    ROS    Objective:     BP 136/69   Pulse 74   Ht 5' 2"$  (1.575 m)   Wt 227 lb (103 kg)   SpO2 94%   BMI 41.52 kg/m    Physical Exam Vitals and nursing note reviewed.  Constitutional:      Appearance: She is well-developed.  HENT:     Head: Normocephalic and atraumatic.  Cardiovascular:     Rate and Rhythm: Normal rate and regular rhythm.     Heart sounds: Normal heart sounds.  Pulmonary:     Effort: Pulmonary effort is normal.     Breath sounds: Normal breath sounds.  Skin:    General: Skin is warm and dry.  Neurological:     Mental Status: She is alert and oriented to person, place, and time.  Psychiatric:        Behavior: Behavior normal.     No results found for any visits on 03/04/22.    The 10-year ASCVD risk score (Arnett DK, et al., 2019) is: 2.4%    Assessment & Plan:   Problem List Items Addressed This Visit       Cardiovascular and Mediastinum   Migraine without aura, not intractable - Primary    She is doing really well with propranolol thus far it has really helped that pressure sensation that she was getting in her head and was daily in fact she  is rarely had to use her rescue medication which is fantastic.  Only thing that she has noticed is that she is gained some weight over the last several months.        Respiratory   Allergic rhinitis    Refill sent to pharmacy for Atrovent nasal spray.        Nervous and Auditory   BPPV (benign paroxysmal positional vertigo), left    She has been going to vestibular rehab she has had 3 sessions thus far but unfortunately is still struggling with the vertigo.  If not continuing to improve in the next couple of weeks then please let us know.        Other   Major depression, recurrent, chronic ([place])    She is doing really well with her current regimen she did mark a few symptoms on the depression questionnaire but she says that is mostly because she has been dealing with the vertigo for the last 3 weeks.  But otherwise she would like to continue with the Zoloft.      Other Visit Diagnoses     Rhinitis, unspecified type       Relevant  Medications   ipratropium (ATROVENT) 0.06 % nasal spray   Low ferritin       Relevant Orders   Fe+TIBC+Fer   Plantar fasciitis, bilateral           Return in about 5 months (around 08/02/2022) for Migraines, Mood,  and labwork .    Beatrice Lecher, MD

## 2022-03-04 NOTE — Assessment & Plan Note (Signed)
She is doing really well with her current regimen she did mark a few symptoms on the depression questionnaire but she says that is mostly because she has been dealing with the vertigo for the last 3 weeks.  But otherwise she would like to continue with the Zoloft.

## 2022-03-05 LAB — IRON,TIBC AND FERRITIN PANEL
%SAT: 14 % (calc) — ABNORMAL LOW (ref 16–45)
Ferritin: 48 ng/mL (ref 16–232)
Iron: 58 ug/dL (ref 40–190)
TIBC: 422 mcg/dL (calc) (ref 250–450)

## 2022-03-05 NOTE — Progress Notes (Signed)
Hi Mandi, Your iron stores are coming up really nicely.  Up to 48 which is fantastic.  Our goal was to get you above 40.  I would recommend continuing the iron for at least another 2 months and then at that point you can stop.  That will give [place] just a little extra boost past where you are and then we can feel comfortable stopping the extra iron.  I would continue working on eating an iron rich diet since you do have a tendency to drop your iron.

## 2022-03-09 DIAGNOSIS — M542 Cervicalgia: Secondary | ICD-10-CM | POA: Diagnosis not present

## 2022-03-09 DIAGNOSIS — M9902 Segmental and somatic dysfunction of thoracic region: Secondary | ICD-10-CM | POA: Diagnosis not present

## 2022-03-09 DIAGNOSIS — M9901 Segmental and somatic dysfunction of cervical region: Secondary | ICD-10-CM | POA: Diagnosis not present

## 2022-03-09 DIAGNOSIS — G44209 Tension-type headache, unspecified, not intractable: Secondary | ICD-10-CM | POA: Diagnosis not present

## 2022-03-10 DIAGNOSIS — G44209 Tension-type headache, unspecified, not intractable: Secondary | ICD-10-CM | POA: Diagnosis not present

## 2022-03-10 DIAGNOSIS — M542 Cervicalgia: Secondary | ICD-10-CM | POA: Diagnosis not present

## 2022-03-10 DIAGNOSIS — M9902 Segmental and somatic dysfunction of thoracic region: Secondary | ICD-10-CM | POA: Diagnosis not present

## 2022-03-10 DIAGNOSIS — M9901 Segmental and somatic dysfunction of cervical region: Secondary | ICD-10-CM | POA: Diagnosis not present

## 2022-03-11 ENCOUNTER — Ambulatory Visit: Payer: Federal, State, Local not specified - PPO

## 2022-03-11 DIAGNOSIS — H8112 Benign paroxysmal vertigo, left ear: Secondary | ICD-10-CM

## 2022-03-11 DIAGNOSIS — R42 Dizziness and giddiness: Secondary | ICD-10-CM

## 2022-03-11 DIAGNOSIS — M542 Cervicalgia: Secondary | ICD-10-CM | POA: Diagnosis not present

## 2022-03-11 DIAGNOSIS — G44209 Tension-type headache, unspecified, not intractable: Secondary | ICD-10-CM | POA: Diagnosis not present

## 2022-03-11 DIAGNOSIS — M9901 Segmental and somatic dysfunction of cervical region: Secondary | ICD-10-CM | POA: Diagnosis not present

## 2022-03-11 DIAGNOSIS — M9902 Segmental and somatic dysfunction of thoracic region: Secondary | ICD-10-CM | POA: Diagnosis not present

## 2022-03-11 NOTE — Therapy (Addendum)
OUTPATIENT PHYSICAL THERAPY VESTIBULAR TREATMENT AND DISCHARGE SUMMARY   Patient Name: Jessica Cunningham MRN: [id number] DOB:[birth year], 47 y.o., female Today's Date: 03/11/2022  PHYSICAL THERAPY DISCHARGE SUMMARY  Visits from Start of Care: 4  Current functional level related to goals / functional outcomes: Patient was making progress per below note- did not return.   Remaining deficits: See note below for last known status.   Education / Equipment: HEP   Patient agrees to discharge. Patient goals were partially met. Patient is being discharged due to not returning since the last visit.   END OF SESSION:  PT End of Session - 03/11/22 1446     Visit Number 4    Number of Visits 8    Date for PT Re-Evaluation 03/21/22    Authorization Type BCBS    PT Start Time 1445    PT Stop Time 1526    PT Time Calculation (min) 41 min    Activity Tolerance Patient tolerated treatment well    Behavior During Therapy WFL for tasks assessed/performed            Past Medical History:  Diagnosis Date   Allergy    Depression    Hyperlipidemia    Past Surgical History:  Procedure Laterality Date   CESAREAN SECTION     x 2    TONSILLECTOMY     WISDOM TOOTH EXTRACTION     Patient Active Problem List   Diagnosis Date Noted   BPPV (benign paroxysmal positional vertigo), left 02/12/2022   Gastroesophageal reflux disease without esophagitis 04/21/2015   RLS (restless legs syndrome) 04/21/2015   Back pain 10/14/2014   Bilateral hand numbness 10/04/2014   Cervical radiculopathy 10/04/2014   Migraine without aura, not intractable 09/02/2014   Major depression, recurrent, chronic (HCC) 10/04/2008   Allergic rhinitis 10/04/2008   PCP: Nani Gasser, MD REFERRING PROVIDER: Dorena Bodo, NP REFERRING DIAG: [phone] (ICD-10-CM) - BPPV (benign paroxysmal positional vertigo), left  THERAPY DIAG:  BPPV (benign paroxysmal positional vertigo), left  Dizziness and giddiness  ONSET  DATE: 02/11/22  Rationale for Evaluation and Treatment: Rehabilitation  SUBJECTIVE:   SUBJECTIVE STATEMENT: Patient states she felt fine Friday - Sunday, started to feel slight increase in symptoms and an acute flare-up of migraine and nausea on Tuesday. Patient states she has been getting cervical adjustments at chiropractor (3 totals). Patient states she has been having less migraines and dizziness since last visit   PERTINENT HISTORY: h/o migraine  PAIN:  Are you having pain? No  PRECAUTIONS: None  WEIGHT BEARING RESTRICTIONS: No  FALLS: Has patient fallen in last 6 months? No  LIVING ENVIRONMENT: Lives with: lives with their family Lives in: House/apartment Occupation: Adult Scientist, product/process development.  PLOF: Independent  PATIENT GOALS: "feel a little better"  OBJECTIVE:   POSTURE:  rounded shoulders and forward head  Cervical ROM:  "I carry my stress in neck"; can be sore  Active A/PROM (deg) eval  Flexion WFLs  Extension   Right lateral flexion   Left lateral flexion   Right rotation   Left rotation   (Blank rows = not tested)  PATIENT SURVEYS:  FOTO 43%  VESTIBULAR ASSESSMENT:  GENERAL OBSERVATION: Ambulates into clinic independently   SYMPTOM BEHAVIOR:  Subjective history: Sudden onset of vertigo beginning 02/11/22  Non-Vestibular symptoms:  none  Type of dizziness: Spinning/Vertigo, Unsteady with head/body turns, and Lightheadedness/Faint  Frequency: daily  Duration: seconds to minutes  Aggravating factors: Induced by position change: rolling to the right and rolling  to the left, Induced by motion: looking up at the ceiling, bending down to the ground, turning body quickly, and turning head quickly, and Worse in the morning  Relieving factors: head stationary  Progression of symptoms: better  OCULOMOTOR EXAM:  Ocular Alignment: normal  Ocular ROM: No Limitations  Spontaneous Nystagmus: absent  Gaze-Induced Nystagmus: absent  Smooth  Pursuits: intact  Saccades: intact   VESTIBULAR - OCULAR REFLEX:   Slow VOR: Comment: slow VOR x 3 reps provokes a 5/10 sensation of dizziness, described as blurry and brings on spinning  VOR Cancellation: Normal  provokes sensation of dizziness  Head-Impulse Test: HIT Right: positive HIT Left: positive demonstrates dec'd use of VOR    POSITIONAL TESTING: Right Dix-Hallpike: downbeating, left nystagmus x 15 seconds indicating L anterior canalithiasis Left Dix-Hallpike: no nystagmus Right Roll Test: apogeotropic nystagmus Left Roll Test: apogeotropic nystagmus *apogeotropic nystagmus are 3-5 beats after rolling and not associated with symptoms of spinning. Will re-assess next session   VESTIBULAR TREATMENT:    [facility] Adult PT Treatment:                                                DATE: 03/10/2022 Neuromuscular re-ed: Standing against pool noodle scap squeezes + cervical retraction Supine shoulder flexion/circles on coregeous ball  Habituated seated repeated forward bending <--> upright sitting x30", x Habituated side rolling <--> supine R&L x10 each Seated VORx1 (R head turns only), metronome @ 100pm x 30" Cervical SNAGs: rotation 10x5", extension 5x5" Fwd amb VORc Y/P each x 80' each --> fwd amb VORc (R head turns only) 2x80' Bkwd amb VORx1 yaw head turns 2L    [facility] Adult PT Treatment:                                                DATE: 03/04/2022 Neuromuscular re-ed: Brandt-Daroff  Seated: VORx1 yaw plane: metronome @ 60bpm 3x30" VORc Y/P plane: metronome @ 40bpm x30" --> 60bpm 3x30" Standing VORc --> walking VORc yaw plane 4L Standing VORx1 yaw plane                                                                                                    02/19/22 Canalith Repositioning:  Epley Right: Number of Reps: 1 Gaze Adaptation:  x1 Viewing Horizontal: Position: seated, Time: 20 seconds, Reps: 1, and Comment: tolerated well  PATIENT EDUCATION: Education details:  Building control surveyor Person educated: Patient Education method: Explanation, Demonstration, and Handouts Education comprehension: verbalized understanding and returned demonstration  HOME EXERCISE PROGRAM:  Access Code: [email or web address] URL: [email or web address] Date: 03/04/2022 Prepared by: Carlynn Herald  Exercises - Seated Gaze Stabilization with Head Rotation  - 2 x daily - 7 x weekly - 1 sets - 1 reps - 20-30 seconds hold - Seated to Fold Over Vestibular Habituation  - 2 x  daily - 7 x weekly - 5 sets - 7-10 reps - 5 sec hold - Brandt-Daroff Vestibular Exercise  - 1 x daily - 7 x weekly - 3 sets - 10 reps - Walking Gaze Stabilization Head Rotation  - 1 x daily - 7 x weekly - 3 sets - 10 reps - Forward Walking Horizontal VOR Cancellation  - 1 x daily - 7 x weekly - 3 sets - 10 reps  GOALS: Goals reviewed with patient? Yes  LONG TERM GOALS: Target date: 03/19/2022   Patient will report compliance with initial HEP.  Baseline: initiated at eval Goal status: INITIAL  2.  Patient will report resolution of dizziness with looking up, bending over, and quick head turns.  Baseline: Notes sensation of dizziness with movement and guarding through neck. Goal status: INITIAL  3. The patient will improve functional status score from 43% up to 61%.  Goal status: INITIAL  4.  The patient will return to driving and social activities without limitations. Baseline: Not driving Goal status: INITIAL  5. The patient will have negative positional testing.  Baseline: + R dix hallpike for L anterior canal BPPV and possible horizontal canal involvement (to further assess)  Goal status: INITIAL  ASSESSMENT:  CLINICAL IMPRESSION:  Patient able to complete all exercises with minimal increase in symptoms; patient reported brief mild symptoms after habituation roling to R side exercises, however symptoms resolved quickly. Patient demonstrated significantly improved equilibrium and stability  during standing and ambulatory exercises.    OBJECTIVE IMPAIRMENTS: Abnormal gait, decreased activity tolerance, decreased balance, dizziness, and impaired vision/preception.   ACTIVITY LIMITATIONS: bending, sleeping, and locomotion level  PARTICIPATION LIMITATIONS: cleaning, driving, and occupation  PERSONAL FACTORS: 1 comorbidity: h/o migraines  are also affecting patient's functional outcome.   REHAB POTENTIAL: Good  CLINICAL DECISION MAKING: Stable/uncomplicated  EVALUATION COMPLEXITY: Low   PLAN:  PT FREQUENCY: 1-2x/week  PT DURATION: 4 weeks  PLANNED INTERVENTIONS: Therapeutic exercises, Therapeutic activity, Neuromuscular re-education, Balance training, Gait training, Patient/Family education, Self Care, Vestibular training, Canalith repositioning, and Manual therapy  PLAN FOR NEXT SESSION: Check LTG; Follow up on progress/symptoms, re-check canals; if all clear and symptoms free, discharge   Sanjuana Mae, PTA 03/11/2022, 3:28 PM

## 2022-03-15 DIAGNOSIS — M542 Cervicalgia: Secondary | ICD-10-CM | POA: Diagnosis not present

## 2022-03-15 DIAGNOSIS — G44209 Tension-type headache, unspecified, not intractable: Secondary | ICD-10-CM | POA: Diagnosis not present

## 2022-03-15 DIAGNOSIS — M9901 Segmental and somatic dysfunction of cervical region: Secondary | ICD-10-CM | POA: Diagnosis not present

## 2022-03-15 DIAGNOSIS — M9902 Segmental and somatic dysfunction of thoracic region: Secondary | ICD-10-CM | POA: Diagnosis not present

## 2022-03-16 DIAGNOSIS — M542 Cervicalgia: Secondary | ICD-10-CM | POA: Diagnosis not present

## 2022-03-16 DIAGNOSIS — M9902 Segmental and somatic dysfunction of thoracic region: Secondary | ICD-10-CM | POA: Diagnosis not present

## 2022-03-16 DIAGNOSIS — G44209 Tension-type headache, unspecified, not intractable: Secondary | ICD-10-CM | POA: Diagnosis not present

## 2022-03-16 DIAGNOSIS — M9901 Segmental and somatic dysfunction of cervical region: Secondary | ICD-10-CM | POA: Diagnosis not present

## 2022-03-18 DIAGNOSIS — G44209 Tension-type headache, unspecified, not intractable: Secondary | ICD-10-CM | POA: Diagnosis not present

## 2022-03-18 DIAGNOSIS — M9901 Segmental and somatic dysfunction of cervical region: Secondary | ICD-10-CM | POA: Diagnosis not present

## 2022-03-18 DIAGNOSIS — M9902 Segmental and somatic dysfunction of thoracic region: Secondary | ICD-10-CM | POA: Diagnosis not present

## 2022-03-18 DIAGNOSIS — M542 Cervicalgia: Secondary | ICD-10-CM | POA: Diagnosis not present

## 2022-03-22 DIAGNOSIS — M542 Cervicalgia: Secondary | ICD-10-CM | POA: Diagnosis not present

## 2022-03-22 DIAGNOSIS — M9901 Segmental and somatic dysfunction of cervical region: Secondary | ICD-10-CM | POA: Diagnosis not present

## 2022-03-22 DIAGNOSIS — M9902 Segmental and somatic dysfunction of thoracic region: Secondary | ICD-10-CM | POA: Diagnosis not present

## 2022-03-22 DIAGNOSIS — G44209 Tension-type headache, unspecified, not intractable: Secondary | ICD-10-CM | POA: Diagnosis not present

## 2022-03-23 DIAGNOSIS — M9902 Segmental and somatic dysfunction of thoracic region: Secondary | ICD-10-CM | POA: Diagnosis not present

## 2022-03-23 DIAGNOSIS — M9901 Segmental and somatic dysfunction of cervical region: Secondary | ICD-10-CM | POA: Diagnosis not present

## 2022-03-23 DIAGNOSIS — G44209 Tension-type headache, unspecified, not intractable: Secondary | ICD-10-CM | POA: Diagnosis not present

## 2022-03-23 DIAGNOSIS — M542 Cervicalgia: Secondary | ICD-10-CM | POA: Diagnosis not present

## 2022-03-25 ENCOUNTER — Telehealth: Payer: Federal, State, Local not specified - PPO | Admitting: Family Medicine

## 2022-03-25 ENCOUNTER — Encounter: Payer: Self-pay | Admitting: Family Medicine

## 2022-03-25 ENCOUNTER — Ambulatory Visit: Payer: Federal, State, Local not specified - PPO

## 2022-03-25 VITALS — Temp 98.7°F

## 2022-03-25 DIAGNOSIS — R509 Fever, unspecified: Secondary | ICD-10-CM

## 2022-03-25 DIAGNOSIS — J069 Acute upper respiratory infection, unspecified: Secondary | ICD-10-CM

## 2022-03-25 DIAGNOSIS — M542 Cervicalgia: Secondary | ICD-10-CM | POA: Diagnosis not present

## 2022-03-25 DIAGNOSIS — M9902 Segmental and somatic dysfunction of thoracic region: Secondary | ICD-10-CM | POA: Diagnosis not present

## 2022-03-25 DIAGNOSIS — R059 Cough, unspecified: Secondary | ICD-10-CM

## 2022-03-25 DIAGNOSIS — G44209 Tension-type headache, unspecified, not intractable: Secondary | ICD-10-CM | POA: Diagnosis not present

## 2022-03-25 DIAGNOSIS — M9901 Segmental and somatic dysfunction of cervical region: Secondary | ICD-10-CM | POA: Diagnosis not present

## 2022-03-25 LAB — POCT INFLUENZA A/B
Influenza A, POC: NEGATIVE
Influenza B, POC: NEGATIVE

## 2022-03-25 LAB — POC COVID19 BINAXNOW: SARS Coronavirus 2 Ag: NEGATIVE

## 2022-03-25 NOTE — Progress Notes (Signed)
HI Mandi,  You are negative for COVID and for flu.  So I do think this is viral.  Recommend symptomatic care okay to use over-the-counter cough and cold medications, stay well-hydrated.  If you feel like you are not getting a little better after the weekend then please let us know.  If you feel your chest is getting worse then please let us know.

## 2022-03-25 NOTE — Progress Notes (Signed)
    Virtual Visit via Video Note  I connected with Jessica Cunningham on 03/25/22 at  1:00 PM EDT by a video enabled telemedicine application and verified that I am speaking with the correct person using two identifiers.   I discussed the limitations of evaluation and management by telemedicine and the availability of in person appointments. The patient expressed understanding and agreed to proceed.  Patient location: at home Provider location: in office  Subjective:    CC:   Chief Complaint  Patient presents with   Sore Throat   Fever   Cough    HPI:  FEver, ST and cough.   Pt reports that her sxs began 2 days ago. She stated that she has a scratchy throat. She said that she has had chills and sweats.   She had a temp of 99.0 yesterday. Took dayquil yesterday and nyquill in the evening.   She hasn't taken a covid test.   She hasn't noticed any white spots in her throat.   She has been coughing up a lot of mucus.no facial pain or pressure. She does report having a headache.      Past medical history, Surgical history, Family history not pertinant except as noted below, Social history, Allergies, and medications have been entered into the medical record, reviewed, and corrections made.    Objective:    General: Speaking clearly in complete sentences without any shortness of breath.  Alert and oriented x3.  Normal judgment. No apparent acute distress.    Impression and Recommendations:    Problem List Items Addressed This Visit   None Visit Diagnoses     Viral upper respiratory tract infection    -  Primary   Relevant Orders   POC COVID-19 (Completed)   POCT Influenza A/B (Completed)      We discussed having her come in to test for flu and COVID especially with a fever and within 48 hours of symptoms she would be a candidate for treatment if she is positive.  There are several other family members that are sick as well so I think it would be helpful to know if it is  flu COVID or another virus.  Continue symptomatic care. Neg for flu COVID.    Orders Placed This Encounter  Procedures   POC COVID-19    Order Specific Question:   Previously tested for COVID-19    Answer:   No    Order Specific Question:   Resident in a congregate (group) care setting    Answer:   No    Order Specific Question:   Employed in healthcare setting    Answer:   No    Order Specific Question:   Pregnant    Answer:   No   POCT Influenza A/B    No orders of the defined types were placed in this encounter.  I discussed the assessment and treatment plan with the patient. The patient was provided an opportunity to ask questions and all were answered. The patient agreed with the plan and demonstrated an understanding of the instructions.   The patient was advised to call back or seek an in-person evaluation if the symptoms worsen or if the condition fails to improve as anticipated.   Beatrice Lecher, MD

## 2022-03-25 NOTE — Progress Notes (Signed)
Pt reports that her sxs began 2 days ago. She stated that she has a scratchy throat. She said that she has had chills and sweats.   She had a temp of 99.0 yesterday. Took dayquil yesterday and nyquill in the evening.   She hasn't taken a covid test.   She hasn't noticed any white spots in her throat.   She has been coughing up a lot of mucus.no facial pain or pressure. She does report having a headache.

## 2022-04-05 ENCOUNTER — Telehealth: Payer: Self-pay | Admitting: General Practice

## 2022-04-05 DIAGNOSIS — M79671 Pain in right foot: Secondary | ICD-10-CM | POA: Diagnosis not present

## 2022-04-05 DIAGNOSIS — X58XXXA Exposure to other specified factors, initial encounter: Secondary | ICD-10-CM | POA: Diagnosis not present

## 2022-04-05 DIAGNOSIS — S99921A Unspecified injury of right foot, initial encounter: Secondary | ICD-10-CM | POA: Diagnosis not present

## 2022-04-05 DIAGNOSIS — S9031XA Contusion of right foot, initial encounter: Secondary | ICD-10-CM | POA: Diagnosis not present

## 2022-04-05 NOTE — Transitions of Care (Post Inpatient/ED Visit) (Signed)
   04/05/2022  Name: Jessica Cunningham MRN: [id number] DOB: [birth year]  Today's TOC FU Call Status: Today's TOC FU Call Status:: Unsuccessul Call (1st Attempt) Unsuccessful Call (1st Attempt) Date: 04/05/22  Attempted to reach the patient regarding the most recent Inpatient/ED visit.  Follow Up Plan: Additional outreach attempts will be made to reach the patient to complete the Transitions of Care (Post Inpatient/ED visit) call.   Signature Tinnie Gens, RN BSN

## 2022-04-08 NOTE — Transitions of Care (Post Inpatient/ED Visit) (Signed)
   04/08/2022  Name: Jessica Cunningham MRN: [id number] DOB: [birth year]  Today's TOC FU Call Status: Today's TOC FU Call Status:: Unsuccessful Call (2nd Attempt) Unsuccessful Call (1st Attempt) Date: 04/05/22 Unsuccessful Call (2nd Attempt) Date: 04/08/22  Attempted to reach the patient regarding the most recent Inpatient/ED visit.  Follow Up Plan: Additional outreach attempts will be made to reach the patient to complete the Transitions of Care (Post Inpatient/ED visit) call.   Signature: Tinnie Gens ,RN BSN

## 2022-04-12 NOTE — Transitions of Care (Post Inpatient/ED Visit) (Signed)
   04/12/2022  Name: Jessica Cunningham MRN: [id number] DOB: 24-May-1975  Today's TOC FU Call Status: Today's TOC FU Call Status:: Unsuccessful Call (3rd Attempt) Unsuccessful Call (1st Attempt) Date: 04/05/22 Unsuccessful Call (2nd Attempt) Date: 04/08/22 Unsuccessful Call (3rd Attempt) Date: 04/12/22  Attempted to reach the patient regarding the most recent Inpatient/ED visit.  Follow Up Plan: No further outreach attempts will be made at this time. We have been unable to contact the patient.  Signature Tinnie Gens, RN BSN

## 2022-04-17 ENCOUNTER — Other Ambulatory Visit: Payer: Self-pay | Admitting: Family Medicine

## 2022-04-17 DIAGNOSIS — G43009 Migraine without aura, not intractable, without status migrainosus: Secondary | ICD-10-CM

## 2022-04-17 DIAGNOSIS — F32A Depression, unspecified: Secondary | ICD-10-CM

## 2022-05-18 DIAGNOSIS — D2272 Melanocytic nevi of left lower limb, including hip: Secondary | ICD-10-CM | POA: Diagnosis not present

## 2022-05-18 DIAGNOSIS — Z129 Encounter for screening for malignant neoplasm, site unspecified: Secondary | ICD-10-CM | POA: Diagnosis not present

## 2022-05-18 DIAGNOSIS — L638 Other alopecia areata: Secondary | ICD-10-CM | POA: Diagnosis not present

## 2022-05-18 DIAGNOSIS — L564 Polymorphous light eruption: Secondary | ICD-10-CM | POA: Diagnosis not present

## 2022-07-29 ENCOUNTER — Encounter: Payer: Self-pay | Admitting: Family Medicine

## 2022-08-09 ENCOUNTER — Ambulatory Visit: Payer: Federal, State, Local not specified - PPO | Admitting: Family Medicine

## 2022-08-09 ENCOUNTER — Encounter: Payer: Self-pay | Admitting: Family Medicine

## 2022-08-09 VITALS — BP 126/67 | HR 73 | Ht 62.0 in | Wt 228.0 lb

## 2022-08-09 DIAGNOSIS — Z1211 Encounter for screening for malignant neoplasm of colon: Secondary | ICD-10-CM

## 2022-08-09 DIAGNOSIS — Z5181 Encounter for therapeutic drug level monitoring: Secondary | ICD-10-CM | POA: Diagnosis not present

## 2022-08-09 DIAGNOSIS — G43009 Migraine without aura, not intractable, without status migrainosus: Secondary | ICD-10-CM

## 2022-08-09 DIAGNOSIS — L639 Alopecia areata, unspecified: Secondary | ICD-10-CM

## 2022-08-09 DIAGNOSIS — Z13220 Encounter for screening for lipoid disorders: Secondary | ICD-10-CM

## 2022-08-09 DIAGNOSIS — F339 Major depressive disorder, recurrent, unspecified: Secondary | ICD-10-CM | POA: Diagnosis not present

## 2022-08-09 DIAGNOSIS — F32A Depression, unspecified: Secondary | ICD-10-CM

## 2022-08-09 MED ORDER — SERTRALINE HCL 100 MG PO TABS: 150.0000 mg | ORAL_TABLET | Freq: Every day | ORAL | Status: DC

## 2022-08-09 NOTE — Progress Notes (Addendum)
   Established Patient Office Visit  Subjective   Patient ID: Jessica Cunningham, female    DOB: [birth year]  Age: 48 y.o. MRN: [id number]  Chief Complaint  Patient presents with   mood   Migraine    HPI   F/U MDD -  she is struggling with mood lately. Almost the feeling of greiving but hasn't lost anyone recently. Work is the same. No major changes. Work can be stressful at time but again nothing unusual.   F/U Migraines -she says she is actually doing really well with her migraine she is not getting daily headaches like she was previously.  Also wanted to let me know that she is being seen by dermatology for alopecia areata.  She says it is getting better.   ROS    Objective:     BP 126/67   Pulse 73   Ht 5\' 2"  (1.575 m)   Wt 228 lb (103.4 kg)   SpO2 98%   BMI 41.70 kg/m    Physical Exam Constitutional:      Appearance: She is well-developed.  HENT:     Head: Normocephalic and atraumatic.  Cardiovascular:     Rate and Rhythm: Normal rate and regular rhythm.     Heart sounds: Normal heart sounds.  Pulmonary:     Effort: Pulmonary effort is normal.     Breath sounds: Normal breath sounds.  Skin:    General: Skin is warm and dry.  Neurological:     Mental Status: She is alert and oriented to person, place, and time.  Psychiatric:        Behavior: Behavior normal.      No results found for any visits on 08/09/22.    The 10-year ASCVD risk score (Arnett DK, et al., 2019) is: 2.1%    Assessment & Plan:   Problem List Items Addressed This Visit       Cardiovascular and Mediastinum   Migraine without aura, not intractable    Doing really well overall uses Relpax as needed.        Relevant Medications   sertraline (ZOLOFT) 100 MG tablet     Other   Major depression, recurrent, chronic (HCC)    Discussed options.  She did go up to 1.5 tabs on sertraline. Call in 3 weeks to see if helping. Can go up 2 tabs.  Or consider adding something for sleep.     Discussed option fo therapy/counseling as well.       Relevant Medications   sertraline (ZOLOFT) 100 MG tablet   Other Relevant Orders   CMP14+EGFR   Lipid Panel With LDL/HDL Ratio   CBC   Other Visit Diagnoses     Screening, lipid    -  Primary   Relevant Orders   CMP14+EGFR   Lipid Panel With LDL/HDL Ratio   CBC   Medication monitoring encounter       Relevant Orders   CMP14+EGFR   Lipid Panel With LDL/HDL Ratio   CBC   Screening for malignant neoplasm of colon       Relevant Orders   Ambulatory referral to Gastroenterology   Acute depression       Relevant Medications   sertraline (ZOLOFT) 100 MG tablet   Alopecia areata           Return in about 6 months (around 02/09/2023).    Nani Gasser, MD

## 2022-08-09 NOTE — Assessment & Plan Note (Signed)
Discussed options.  She did go up to 1.5 tabs on sertraline. Call in 3 weeks to see if helping. Can go up 2 tabs.  Or consider adding something for sleep.    Discussed option fo therapy/counseling as well.

## 2022-08-09 NOTE — Assessment & Plan Note (Signed)
Doing really well overall uses Relpax as needed.

## 2022-08-09 NOTE — Patient Instructions (Signed)
See me a MyChart in about 3 weeks to let me know how you are doing and again we could consider adding something specifically to help you rest or sleep.  Or adjust the medication again if needed.

## 2022-08-11 NOTE — Progress Notes (Signed)
Hi Mandi, metabolic panel looks good.  Your LDL cholesterol is up at 133, goal is less than 100 so just continue to work on healthy diet and regular exercise.  Your hemoglobin looks great at 14 this time.  No anemia.  We have placed your referral for GI.

## 2022-10-07 DIAGNOSIS — Z1231 Encounter for screening mammogram for malignant neoplasm of breast: Secondary | ICD-10-CM | POA: Diagnosis not present

## 2022-10-07 DIAGNOSIS — Z01419 Encounter for gynecological examination (general) (routine) without abnormal findings: Secondary | ICD-10-CM | POA: Diagnosis not present

## 2022-10-07 DIAGNOSIS — F3289 Other specified depressive episodes: Secondary | ICD-10-CM | POA: Diagnosis not present

## 2022-10-07 DIAGNOSIS — Z6841 Body Mass Index (BMI) 40.0 and over, adult: Secondary | ICD-10-CM | POA: Diagnosis not present

## 2022-10-07 LAB — HM MAMMOGRAPHY

## 2022-10-14 ENCOUNTER — Encounter: Payer: Self-pay | Admitting: Family Medicine

## 2022-10-27 ENCOUNTER — Encounter: Payer: Self-pay | Admitting: Family Medicine

## 2022-11-29 ENCOUNTER — Other Ambulatory Visit: Payer: Self-pay | Admitting: Family Medicine

## 2022-11-29 ENCOUNTER — Encounter: Payer: Self-pay | Admitting: Family Medicine

## 2022-11-29 ENCOUNTER — Ambulatory Visit (INDEPENDENT_AMBULATORY_CARE_PROVIDER_SITE_OTHER): Payer: Federal, State, Local not specified - PPO | Admitting: Family Medicine

## 2022-11-29 ENCOUNTER — Ambulatory Visit: Payer: Federal, State, Local not specified - PPO

## 2022-11-29 VITALS — BP 128/79 | HR 73 | Ht 62.0 in | Wt 235.5 lb

## 2022-11-29 DIAGNOSIS — R062 Wheezing: Secondary | ICD-10-CM

## 2022-11-29 DIAGNOSIS — J329 Chronic sinusitis, unspecified: Secondary | ICD-10-CM | POA: Diagnosis not present

## 2022-11-29 DIAGNOSIS — J40 Bronchitis, not specified as acute or chronic: Secondary | ICD-10-CM

## 2022-11-29 DIAGNOSIS — G43009 Migraine without aura, not intractable, without status migrainosus: Secondary | ICD-10-CM

## 2022-11-29 DIAGNOSIS — R059 Cough, unspecified: Secondary | ICD-10-CM | POA: Diagnosis not present

## 2022-11-29 MED ORDER — ALBUTEROL SULFATE HFA 108 (90 BASE) MCG/ACT IN AERS: 2.0000 | INHALATION_SPRAY | Freq: Four times a day (QID) | RESPIRATORY_TRACT | 0 refills | Status: AC | PRN

## 2022-11-29 MED ORDER — METHYLPREDNISOLONE 4 MG PO TBPK: ORAL_TABLET | ORAL | 0 refills | Status: DC

## 2022-11-29 MED ORDER — GUAIFENESIN-CODEINE 100-10 MG/5ML PO SOLN: 5.0000 mL | Freq: Three times a day (TID) | ORAL | 0 refills | Status: DC | PRN

## 2022-11-29 MED ORDER — DOXYCYCLINE HYCLATE 100 MG PO TABS: 100.0000 mg | ORAL_TABLET | Freq: Two times a day (BID) | ORAL | 0 refills | Status: AC

## 2022-11-29 NOTE — Progress Notes (Signed)
Established patient visit   Patient: Jessica Cunningham   DOB: Jun 11, 1975   47 y.o. Female  MRN: [id number] Visit Date: 11/29/2022  Today's healthcare provider: Charlton Amor, DO   Chief Complaint  Patient presents with   Cough    Sore throat, congestion, body aches    SUBJECTIVE    Chief Complaint  Patient presents with   Cough    Sore throat, congestion, body aches   HPI HPI     Cough    Additional comments: Sore throat, congestion, body aches      Last edited by Roselyn Reef, CMA on 11/29/2022  1:11 PM.      Pt presents for sick visit. Has had cough and congestion. Doing supportive treatment at home. Does says she feels short of breath with the coughing fits. Did get tested at work for covid, flu, and strep and all was negative.   Review of Systems  Constitutional:  Negative for activity change, fatigue and fever.  HENT:  Positive for congestion.   Respiratory:  Positive for cough. Negative for shortness of breath.   Cardiovascular:  Negative for chest pain.  Gastrointestinal:  Negative for abdominal pain.  Genitourinary:  Negative for difficulty urinating.       Current Meds  Medication Sig   albuterol (VENTOLIN HFA) 108 (90 Base) MCG/ACT inhaler Inhale 2 puffs into the lungs every 6 (six) hours as needed for wheezing or shortness of breath.   diclofenac sodium (VOLTAREN) 1 % GEL Apply 2 g topically 4 (four) times daily. To affected joint.   doxycycline (VIBRA-TABS) 100 MG tablet Take 1 tablet (100 mg total) by mouth 2 (two) times daily for 7 days.   eletriptan (RELPAX) 20 MG tablet Take 1 tablet (20 mg total) by mouth as needed for migraine or headache. May repeat in 2 hours if headache persists or recurs.   esomeprazole (NEXIUM) 20 MG capsule Take 1 capsule (20 mg total) by mouth daily as needed.   fexofenadine (ALLEGRA) 180 MG tablet Take 180 mg by mouth daily.   guaiFENesin-codeine 100-10 MG/5ML syrup Take 5 mLs by mouth 3 (three) times daily as needed  for cough.   ipratropium (ATROVENT) 0.06 % nasal spray Place 1 spray into both nostrils 4 (four) times daily as needed.   Krill Oil 500 MG CAPS Take 500 mg by mouth daily.   Levonorgestrel-Ethinyl Estradiol (AMETHIA) 0.15-0.03 &0.01 MG tablet Take 1 tablet by mouth daily.   methylPREDNISolone (MEDROL DOSEPAK) 4 MG TBPK tablet Follow instructions on pill pack   montelukast (SINGULAIR) 10 MG tablet TAKE 1 TABLET (10 MG TOTAL) BY MOUTH AT BEDTIME.   propranolol ER (INDERAL LA) 60 MG 24 hr capsule TAKE 1 CAPSULE (60 MG TOTAL) BY MOUTH AT BEDTIME. FOR MIGRAINES   rOPINIRole (REQUIP) 0.25 MG tablet Take 1-2 tablets (0.25-0.5 mg total) by mouth at bedtime.   sertraline (ZOLOFT) 100 MG tablet Take 1.5 tablets (150 mg total) by mouth daily.    OBJECTIVE    BP 128/79 (BP Location: Left Arm, Patient Position: Sitting, Cuff Size: Large)   Pulse 73   Ht 5\' 2"  (1.575 m)   Wt 235 lb 8 oz (106.8 kg)   SpO2 97%   BMI 43.07 kg/m   Physical Exam Vitals and nursing note reviewed.  Constitutional:      General: She is not in acute distress.    Appearance: Normal appearance.  HENT:     Head: Normocephalic and atraumatic.  Right Ear: External ear normal.     Left Ear: External ear normal.     Nose: Nose normal.  Eyes:     Conjunctiva/sclera: Conjunctivae normal.  Cardiovascular:     Rate and Rhythm: Normal rate and regular rhythm.  Pulmonary:     Effort: Pulmonary effort is normal.     Breath sounds: Wheezing present.  Neurological:     General: No focal deficit present.     Mental Status: She is alert and oriented to person, place, and time.  Psychiatric:        Mood and Affect: Mood normal.        Behavior: Behavior normal.        Thought Content: Thought content normal.        Judgment: Judgment normal.        ASSESSMENT & PLAN    Problem List Items Addressed This Visit       Respiratory   Sinobronchitis - Primary    Will go ahead and treat with doxycycline       Relevant  Medications   methylPREDNISolone (MEDROL DOSEPAK) 4 MG TBPK tablet   albuterol (VENTOLIN HFA) 108 (90 Base) MCG/ACT inhaler   guaiFENesin-codeine 100-10 MG/5ML syrup   doxycycline (VIBRA-TABS) 100 MG tablet     Other   Wheezing    Cxr ordered to investigate for pna - given albuterol and robitussin for cough along with medrol dose pack  - cxr negative for pna      Relevant Orders   DG Chest 2 View (Completed)    No follow-ups on file.      Meds ordered this encounter  Medications   methylPREDNISolone (MEDROL DOSEPAK) 4 MG TBPK tablet    Sig: Follow instructions on pill pack    Dispense:  21 tablet    Refill:  0   albuterol (VENTOLIN HFA) 108 (90 Base) MCG/ACT inhaler    Sig: Inhale 2 puffs into the lungs every 6 (six) hours as needed for wheezing or shortness of breath.    Dispense:  8 g    Refill:  0   guaiFENesin-codeine 100-10 MG/5ML syrup    Sig: Take 5 mLs by mouth 3 (three) times daily as needed for cough.    Dispense:  240 mL    Refill:  0   doxycycline (VIBRA-TABS) 100 MG tablet    Sig: Take 1 tablet (100 mg total) by mouth 2 (two) times daily for 7 days.    Dispense:  14 tablet    Refill:  0    Orders Placed This Encounter  Procedures   DG Chest 2 View    Standing Status:   Future    Number of Occurrences:   1    Standing Expiration Date:   11/29/2023    Order Specific Question:   Preferred imaging location?    Answer:   [facility]    Order Specific Question:   Reason for exam:    Answer:   wheezing    Order Specific Question:   Release to patient    Answer:   Immediate     Charlton Amor, DO  [facility] at [facility] [phone] (phone) [phone] (fax)  [facility]

## 2022-11-29 NOTE — Assessment & Plan Note (Signed)
Will go ahead and treat with doxycycline

## 2022-11-29 NOTE — Assessment & Plan Note (Addendum)
Cxr ordered to investigate for pna - given albuterol and robitussin for cough along with medrol dose pack  - cxr negative for pna

## 2022-12-08 DIAGNOSIS — Z1211 Encounter for screening for malignant neoplasm of colon: Secondary | ICD-10-CM | POA: Diagnosis not present

## 2022-12-08 LAB — HM COLONOSCOPY

## 2022-12-25 ENCOUNTER — Other Ambulatory Visit: Payer: Self-pay | Admitting: Family Medicine

## 2022-12-25 DIAGNOSIS — F32A Depression, unspecified: Secondary | ICD-10-CM

## 2022-12-29 ENCOUNTER — Encounter: Payer: Self-pay | Admitting: Family Medicine

## 2022-12-29 DIAGNOSIS — F32A Depression, unspecified: Secondary | ICD-10-CM

## 2022-12-29 MED ORDER — SERTRALINE HCL 100 MG PO TABS: 150.0000 mg | ORAL_TABLET | Freq: Every day | ORAL | Status: DC

## 2023-01-17 MED ORDER — SERTRALINE HCL 100 MG PO TABS: 150.0000 mg | ORAL_TABLET | Freq: Every day | ORAL | 0 refills | Status: DC

## 2023-01-24 ENCOUNTER — Encounter: Payer: Self-pay | Admitting: Family Medicine

## 2023-01-24 ENCOUNTER — Ambulatory Visit (INDEPENDENT_AMBULATORY_CARE_PROVIDER_SITE_OTHER): Payer: Federal, State, Local not specified - PPO | Admitting: Family Medicine

## 2023-01-24 VITALS — BP 126/73 | HR 68 | Ht 62.0 in | Wt 235.0 lb

## 2023-01-24 DIAGNOSIS — R0683 Snoring: Secondary | ICD-10-CM | POA: Diagnosis not present

## 2023-01-24 DIAGNOSIS — F339 Major depressive disorder, recurrent, unspecified: Secondary | ICD-10-CM | POA: Diagnosis not present

## 2023-01-24 DIAGNOSIS — R635 Abnormal weight gain: Secondary | ICD-10-CM | POA: Diagnosis not present

## 2023-01-24 DIAGNOSIS — F32A Depression, unspecified: Secondary | ICD-10-CM

## 2023-01-24 DIAGNOSIS — G4730 Sleep apnea, unspecified: Secondary | ICD-10-CM | POA: Insufficient documentation

## 2023-01-24 MED ORDER — BUPROPION HCL ER (XL) 150 MG PO TB24: 150.0000 mg | ORAL_TABLET | Freq: Every day | ORAL | 0 refills | Status: DC

## 2023-01-24 NOTE — Assessment & Plan Note (Signed)
 Bang score of 5.  Will refer for sleep study we gave her options of in-home or in lab and she would prefer to do the in lab study.  Will follow-up to do a virtual visit to discuss results once available if the in-house study is not covered we will switch to a at home study.

## 2023-01-24 NOTE — Progress Notes (Signed)
 Established Patient Office Visit  Subjective  Patient ID: Jessica Cunningham, female    DOB: [birth year]  Age: 48 y.o. MRN: [id number]  Chief Complaint  Patient presents with   mood    HPI F/U depression -I last saw her we decided to increase the sertraline  to 150 mg.  She does feel like it has been helpful but at the same time she has had a rapid increase in weight gain without any major changes in other parts of her life.  She is to be about 25 pounds lighter and stated that weight for years until more recently.  She also feels like she might be going into menopause and that could be contributing as well.  She had tried Wellbutrin  years ago and got a little handshaking with it.   Flowsheet Row Office Visit from 01/24/2023 in [facility] at [facility]  PHQ-9 Total Score 7      She has been gaining weight since start the 150mg  dose of sertraline . No changes ot diet.    She would like to be evalu for sleep apnea.  She snores, easily falls asleep during the day.  Family reports that it is more loud snoring she sometimes will wake up gasping for air.  No family history of sleep apnea.    ROS    Objective:     BP 126/73   Pulse 68   Ht 5' 2 (1.575 m)   Wt 235 lb (106.6 kg)   SpO2 96%   BMI 42.98 kg/m    Physical Exam Vitals and nursing note reviewed.  Constitutional:      Appearance: Normal appearance.  HENT:     Head: Normocephalic and atraumatic.  Eyes:     Conjunctiva/sclera: Conjunctivae normal.  Cardiovascular:     Rate and Rhythm: Normal rate and regular rhythm.  Pulmonary:     Effort: Pulmonary effort is normal.     Breath sounds: Normal breath sounds.  Skin:    General: Skin is warm and dry.  Neurological:     Mental Status: She is alert.  Psychiatric:        Mood and Affect: Mood normal.      No results found for any visits on 01/24/23.    The 10-year ASCVD risk score (Arnett DK, et al., 2019) is:  2.9%    Assessment & Plan:   Problem List Items Addressed This Visit       Other   Snoring - Primary   Bang score of 5.  Will refer for sleep study we gave her options of in-home or in lab and she would prefer to do the in lab study.  Will follow-up to do a virtual visit to discuss results once available if the in-house study is not covered we will switch to a at home study.      Relevant Orders   Split night study   Major depression, recurrent, chronic (HCC)   Discussed options.  I am concerned about the significant change in weight so we discussed going back down on the dose to 100 mg and adding a low-dose of Wellbutrin  plan to follow-up in about 7 to 8 weeks and then we can make further adjustments if needed at that time.  Continue to work on the pepsi and staying active.      Relevant Medications   buPROPion  (WELLBUTRIN  XL) 150 MG 24 hr tablet   Other Visit Diagnoses  Acute depression       Relevant Medications   buPROPion  (WELLBUTRIN  XL) 150 MG 24 hr tablet     Weight gain           Return in about 8 weeks (around 03/21/2023) for Mood/MEd change .    Dorothyann Byars, MD

## 2023-01-24 NOTE — Assessment & Plan Note (Signed)
 Discussed options.  I am concerned about the significant change in weight so we discussed going back down on the dose to 100 mg and adding a low-dose of Wellbutrin  plan to follow-up in about 7 to 8 weeks and then we can make further adjustments if needed at that time.  Continue to work on the pepsi and staying active.

## 2023-02-09 ENCOUNTER — Other Ambulatory Visit: Payer: Self-pay | Admitting: Family Medicine

## 2023-02-09 DIAGNOSIS — F32A Depression, unspecified: Secondary | ICD-10-CM

## 2023-02-10 ENCOUNTER — Other Ambulatory Visit: Payer: Self-pay | Admitting: Family Medicine

## 2023-02-10 DIAGNOSIS — G43009 Migraine without aura, not intractable, without status migrainosus: Secondary | ICD-10-CM

## 2023-04-10 ENCOUNTER — Ambulatory Visit (HOSPITAL_BASED_OUTPATIENT_CLINIC_OR_DEPARTMENT_OTHER): Payer: Federal, State, Local not specified - PPO | Admitting: Internal Medicine

## 2023-04-18 ENCOUNTER — Ambulatory Visit: Admitting: Family Medicine

## 2023-04-18 ENCOUNTER — Encounter: Payer: Self-pay | Admitting: Family Medicine

## 2023-04-18 VITALS — BP 121/83 | HR 83 | Ht 62.0 in | Wt 235.8 lb

## 2023-04-18 DIAGNOSIS — F339 Major depressive disorder, recurrent, unspecified: Secondary | ICD-10-CM

## 2023-04-18 DIAGNOSIS — G2581 Restless legs syndrome: Secondary | ICD-10-CM | POA: Diagnosis not present

## 2023-04-18 DIAGNOSIS — R0683 Snoring: Secondary | ICD-10-CM | POA: Diagnosis not present

## 2023-04-18 DIAGNOSIS — F32A Depression, unspecified: Secondary | ICD-10-CM

## 2023-04-18 MED ORDER — DULOXETINE HCL 30 MG PO CPEP: 30.0000 mg | ORAL_CAPSULE | Freq: Every day | ORAL | 1 refills | Status: DC

## 2023-04-18 MED ORDER — SERTRALINE HCL 25 MG PO TABS: ORAL_TABLET | ORAL | 0 refills | Status: DC

## 2023-04-18 NOTE — Patient Instructions (Signed)
 Decrease Wellbutrin to 1 tab every other day for 8 days and then stop. Please follow the taper instructions for the sertraline. When you get down to 25 mg daily for that last week you can actually start the Cymbalta.  That way you would technically be taking both daily for 1 week and then the sertraline will end and you will stay on that same dose.

## 2023-04-18 NOTE — Assessment & Plan Note (Signed)
 Scheduled for sleep appointment next week.  Its unfortunate they were unable to get her service last week.

## 2023-04-18 NOTE — Assessment & Plan Note (Signed)
 He has not really needed to use the ropinirole she has been taking a magnesium supplement at bedtime and that seems to be working well.  Will keep it on the med list in case she does want a refill she does use it occasionally.

## 2023-04-18 NOTE — Progress Notes (Signed)
 Established Patient Office Visit  Subjective  Patient ID: Jessica Cunningham, female    DOB: April 06, 1975  Age: 48 y.o. MRN: [id number]  Chief Complaint  Patient presents with   Medical Management of Chronic Issues    Depression med f/u    HPI   F/U depression -she feels like there is just a lot of things going on all at 1 time just the perfect storm of things.  She did try to go for her sleep study last week but that said that scheduled to many people and had to turn her away and reschedule her for this weeks.  But in the meantime she has been trying to monitor her sleep with her Apple watch and it has been showing a lot of spikes into that wake Sonam so she is wondering if she really does have sleep apnea.  Work has been stressful as well.  She just at the point where she feels like her sertraline really is not working well still having some symptoms of short fuse and feeling down.       ROS    Objective:     BP 121/83 (BP Location: Left Arm, Patient Position: Sitting, Cuff Size: Large)   Pulse 83   Ht 5\' 2"  (1.575 m)   Wt 235 lb 12 oz (106.9 kg)   SpO2 96%   BMI 43.12 kg/m    Physical Exam Vitals and nursing note reviewed.  Constitutional:      Appearance: Normal appearance.  HENT:     Head: Normocephalic and atraumatic.  Eyes:     Conjunctiva/sclera: Conjunctivae normal.  Cardiovascular:     Rate and Rhythm: Normal rate and regular rhythm.  Pulmonary:     Effort: Pulmonary effort is normal.     Breath sounds: Normal breath sounds.  Skin:    General: Skin is warm and dry.  Neurological:     Mental Status: She is alert.  Psychiatric:        Mood and Affect: Mood normal.      No results found for any visits on 04/18/23.    The 10-year ASCVD risk score (Arnett DK, et al., 2019) is: 2.6%    Assessment & Plan:   Problem List Items Addressed This Visit       Other   Snoring   Scheduled for sleep appointment next week.  Its unfortunate they were  unable to get her service last week.      RLS (restless legs syndrome)   He has not really needed to use the ropinirole she has been taking a magnesium supplement at bedtime and that seems to be working well.  Will keep it on the med list in case she does want a refill she does use it occasionally.      Major depression, recurrent, chronic (HCC)   Discussed options.  She has tried citalopram and fluoxetine in the past.  Will taper off of sertraline and try Cymbalta instead.  Go ahead and taper off Wellbutrin as the addition of the medication really has not been that helpful.  That she has used it in the past and did well at that time  Decrease Wellbutrin to 1 tab every other day for 8 days and then stop. Please follow the taper instructions for the sertraline. When you get down to 25 mg daily for that last week you can actually start the Cymbalta.  That way you would technically be taking both daily for 1 week and then the  sertraline will end and you will stay on that same dose.      Relevant Medications   sertraline (ZOLOFT) 25 MG tablet   DULoxetine (CYMBALTA) 30 MG capsule   Other Visit Diagnoses       Acute depression    -  Primary   Relevant Medications   sertraline (ZOLOFT) 25 MG tablet   DULoxetine (CYMBALTA) 30 MG capsule       Return in about 7 weeks (around 06/06/2023) for Mood.    Nani Gasser, MD

## 2023-04-18 NOTE — Assessment & Plan Note (Signed)
 Discussed options.  She has tried citalopram and fluoxetine in the past.  Will taper off of sertraline and try Cymbalta instead.  Go ahead and taper off Wellbutrin as the addition of the medication really has not been that helpful.  That she has used it in the past and did well at that time  Decrease Wellbutrin to 1 tab every other day for 8 days and then stop. Please follow the taper instructions for the sertraline. When you get down to 25 mg daily for that last week you can actually start the Cymbalta.  That way you would technically be taking both daily for 1 week and then the sertraline will end and you will stay on that same dose.

## 2023-04-27 ENCOUNTER — Ambulatory Visit (HOSPITAL_BASED_OUTPATIENT_CLINIC_OR_DEPARTMENT_OTHER): Attending: Family Medicine | Admitting: Internal Medicine

## 2023-04-27 DIAGNOSIS — R0683 Snoring: Secondary | ICD-10-CM | POA: Diagnosis not present

## 2023-04-27 DIAGNOSIS — G4733 Obstructive sleep apnea (adult) (pediatric): Secondary | ICD-10-CM | POA: Insufficient documentation

## 2023-05-08 DIAGNOSIS — R0683 Snoring: Secondary | ICD-10-CM

## 2023-05-08 NOTE — Procedures (Signed)
 [facility] [address] [place], [place] [address] Tel: [phone]   Fax: [phone]  Split Night Interpretation  Patient Name:  Jessica Cunningham, Jessica Cunningham Date:  04/27/2023 Referring Physician:  Cydney Draft, Md  Indications for Polysomnography The patient is a 48 year old Female who is 5\' 2"  and weighs 230.0 lbs.  Her BMI equals 42.3.  A diagnostic polysomnogram was performed to evaluate for -.  After 121.0 minutes of sleep time the patient exhibited sufficient respiratory events qualifying her for a CPAP trial which was then initiated.    Medication taken at 9 pm  -  Amethia 0.15-0.03 &0.01 mg  Inderal  La 60 mg   Polysomnogram Data A full night polysomnogram was performed recording the standard physiologic parameters including EEG, EOG, EMG, EKG, nasal and oral airflow.  Respiratory parameters of chest and abdominal movements are recorded with [place]-Crystal motion transducers.  Oxygen saturation was recorded by pulse oximetry.    Sleep Architecture The total recording time of the diagnostic portion of the study was 245.5 minutes.  The total sleep time was 121.0 minutes.  During the diagnostic portion of the study, the patient spent 20.2% of total sleep time in Stage N1, 38.8% in Stage N2, 37.6% in Stages N3, and 3.3% in REM.   Sleep latency was 64.0 minutes.  REM latency was 151.0 minutes.  Sleep Efficiency was 49.3%.  Wake after Sleep Onset time was 60.5 minutes.   At 02:05:07 AM the patient was placed on PAP treatment and was titrated at pressures ranging from 9* cm/H20 with supplemental oxygen at - up to 23/19 cm/H20 with supplemental oxygen at -.  The total recording time of the treatment portion of the study was 177.5 minutes.  The total sleep time was 151.5 minutes.  During the treatment portion of the study, the patient spent 4.3% of total sleep time in Stage N1, 16.5% in Stage N2, 0.0% in Stages N3, and 79.2% in REM.   Sleep  latency was 0.0 minutes.  REM latency was 22.5 minutes.  Sleep Efficiency was 85.3%.  Wake after Sleep Onset time was 26.0 minutes.  Respiratory Events During the diagnostic portion of the study, the polysomnogram revealed a presence of 73 obstructive, 1 central, and - mixed apneas resulting in an Apnea index of 36.7 events per hour.  There were 53 hypopneas (>=3% desaturation and/or arousal) resulting in an Apnea\Hypopnea Index (AHI >=3% desaturation and/or arousal) of 63.0 events per hour.  There were 33 hypopneas (>=4% desaturation) resulting in an Apnea\Hypopnea Index (AHI >=4% desaturation) of 53.1 events per hour.  There were - Respiratory Effort Related Arousals resulting in a RERA index of - events per hour. The Respiratory Disturbance Index is 63.0 events per hour.  The snore index was - events per hour.  Mean oxygen saturation was 92.2%.  The lowest oxygen saturation during sleep was 80.0%.  Time spent <=88% oxygen saturation was 19.4 minutes (8.1%).  End Tidal CO2 during sleep ranged from - to - mmHg. End Tidal CO2 was greater than 50 mmHg for - minutes and greater than 55 mmHg for - minutes.  During the treatment portion of the study, the polysomnogram revealed a presence of 1 obstructive, - central, and - mixed apneas resulting in an Apnea index of 0.4 events per hour.  There were 36 hypopneas (>=3% desaturation and/or arousal) resulting in an Apnea\Hypopnea Index (AHI >=3% desaturation and/or arousal) of 14.7 events per hour.  There were 16 hypopneas (>=4% desaturation)  resulting in an Apnea\Hypopnea Index (AHI >=4% desaturation) of 6.7 events per hour.  There were - Respiratory Effort Related Arousals resulting in a RERA index of - events per hour. The Respiratory Disturbance Index is 14.7 events per hour.  The snore index was - events per hour.  Mean oxygen saturation was 93.8%.  The lowest oxygen saturation during sleep was 85.0%.  Time spent <=88% oxygen saturation was 1.1 minutes  (0.7%).  Limb Activity During the diagnostic portion of the study, there were - limb movements recorded.  Of this total, - were classified as PLMs.  Of the PLMs, - were associated with arousals.  The Limb Movement index was - per hour while the PLM index was - per hour.  During the treatment portion of the study, there were - limb movements recorded.  Of this total, - were classified as PLMs.  Of the PLMs, - were associated with arousals.  The Limb Movement index was - per hour while the PLM index was - per hour.  Cardiac Summary During the diagnostic portion of the study, the average pulse rate was 74.0 bpm.  The minimum pulse rate was 58.0 bpm while the maximum pulse rate was 196.0 bpm.  During the treatment portion of the study, the average pulse rate was 70.3 bpm.  The minimum pulse rate was 57.0 bpm while the maximum pulse rate was 93.0 bpm.   Comment: Split night protocol criteria met. Diagnostic phase severe OSA, AHI (4%) 53.1/ hr with snoring and oxygen desaturation to a nadir of 80%, mean 92.2%. CPAP titration was not tolerated and was changed to BIPAP titration. Final BIPAP 23/19 with residual AHI (4%) 0/hr, minimum O2 saturation 93%.  Diagnosis: Obstructive sleep apnea  Recommendations: Suggest BIPAP 20/19, PS 0. Patie4nt wore aa small ResMed AirFit F 10 For Her mask with heated humidification.   This study was personally reviewed and electronically signed by: Rosa College, [place] Accredited Board Certified in Sleep Medicine Date/Time: 05/08/23  12:36       Split Night Report  Patient Name: Jessica Cunningham, Jessica Cunningham Date: 04/27/2023  Date of Birth: 07-Oct-1975 Study Type: Split Night  Age: 48 year MRN #: [id number]  Sex: Female Interpreting Physician: Rosa College [id number]  Height: 5\' 2"  Referring Physician: Cydney Draft, Md  Weight: 230.0 lbs Recording Tech: Iva Mariner RPSGT RST  BMI: 42.3 Scoring Tech: Iva Mariner RPSGT RST  ESS: 21 Neck Size: 16  Mask Type  Airfit f 10 for her mask Final Pressure: BILEVEL pressure 23/19cm H20  Mask Size: Small Supplemental O2: -   Study Overview  DIAGNOSTIC TREATMENT  Lights Off: 09:59:22 PM Lights Off: 02:04:53 AM  Lights On: 02:04:53 AM Lights On: 05:02:23 AM  Time in Bed: 245.5 min. Time in Bed: 177.5 min.  Total Sleep Time: 121.0 min. Total Sleep Time: 151.5 min.  Sleep Efficiency: 49.3% Sleep Efficiency: 85.3%  Sleep Latency: 64.0 min. Sleep Latency: 0.0 min.  REM Latency from Sleep Onset: 151.0 min. REM Latency from Sleep Onset: 22.5 min.  Wake After Sleep Onset: 60.5 min. Wake After Sleep Onset: 26.0 min.   DIAGNOSTIC TREATMENT   Count Index  Count Index  Awakenings: 20 9.9 Awakenings: 8 3.2  Arousals: 104 51.6 Arousals: 25 9.9  AHI (>=3% Desat and/or Ar.): 127 63.0 AHI (>=3% Desat and/or Ar.): 37 14.7  AHI (>=4% Desat): 107 53.1 AHI (>=4% Desat): 17 6.7   Limb Movements: - - Limb Movements: - -  Snore: - - Snore: - -  Desaturations:  129 64.0 Desaturations: 43 17.0  Minimum SpO2 TST: 80.0% Minimum SpO2 TST: 85.0%    Sleep Architecture   DIAGNOSTIC TREATMENT ENTIRE NIGHT  Stages Time (mins) % Sleep Time Time (mins) % Sleep Time Time (mins) % Sleep Time  Wake 125.0  26.5  151.5   Stage N1 24.5 20.2% 6.5 4.3% 31.0 11.4%  Stage N2 47.0 38.8% 25.0 16.5% 72.0 26.4%  Stage N3 45.5 37.6% 0.0 0.0% 45.5 16.7%  REM 4.0 3.3% 120.0 79.2% 124.0 45.5%   Arousal Summary   DIAGNOSTIC TREATMENT   NREM REM TST Index NREM REM TST Index  Respiratory Ar. 69 6 75 37.2 6 1 7  2.8  PLM Ar. - - - - - - - -  Isolated Limb Movement Ar. - - - - - - - -  Snore Ar. - - - - - - - -  Spontaneous Ar. 27 2 29  14.4 5 13 18  7.1  Total Ar. 96 8 104 51.6 11 14 25  9.9    Respiratory Summary  DIAGNOSTIC By Sleep Stage By Body Position Total   NREM REM Supine Non-Supine   Time (min) 117.0 4.0 37.5 83.5 121.0         Obstructive Apnea 70 3 55 18 73  Mixed Apnea - - - - -  Central Apnea 1 - 1 - 1  Total Apneas 71 3  56 18 74  Total Apnea Index 36.4 45.0 89.6 12.9 36.7         Hypopneas (>=3% Desat and/or Ar.) 49 4 24 29  53  AHI (>=3% Desat and/or Ar.) 61.5 105.0 128.0 33.8 63.0         Hypopneas (>=4% Desat) 29 4 12 21  33  AHI (>=4% Desat) 51.3 105.0 108.8 28.0 53.1          RERAs - - - - -  RERA Index - - - - -         RDI 61.5 105.0 128.0 33.8 63.0    TREATMENT By Sleep Stage By Body Position Total   NREM REM Supine Non-Supine   Time (min) 31.5 120.0 140.5 11.0 151.5         Obstructive Apnea - 1 1 - 1  Mixed Apnea - - - - -  Central Apnea - - - - -  Total Apneas - 1 1 - 1  Total Apnea Index - 0.5 0.4 - 0.4         Hypopneas (>=3% Desat and/or Ar.) 19 17 26 10  36  AHI (>=3% Desat and/or Ar.) 36.2 9.0 11.5 54.5 14.7         Hypopneas (>=4% Desat) 9 7 12 4 16   AHI (>=4% Desat) 17.1 4.0 5.6 21.8 6.7          RERAs - - - - -  RERA Index - - - - -         RDI 36.2 9.0 11.5 54.5 14.7    Respiratory Event Durations   DIAGNOSTIC TREATMENT  Apnea NREM REM NREM REM  Average (seconds) 15.1 14.4 - 10.6  Maximum (seconds) 28.7 16.8 - 10.6  Hypopnea      Average (seconds) 15.5 18.1 16.8 26.8  Maximum (seconds) 24.6 21.4 25.1 58.9    Limb Movement Summary   DIAGNOSTIC TREATMENT   Count Index Count Index  Isolated Limb Movements - - - -  Periodic Limb Movements (PLMs) - - - -  Total Limb Movements - - - -    Oxygen  Saturation Summary   DIAGNOSTIC TREATMENT   Wake NREM REM TST Wake NREM REM TST  Average SpO2 93.5% 91.0% 89.9% 91.0% 95.4% 93.6% 93.6% 93.6%  Minimum SpO2 82.0% 80.0% 83.0% 80.0%  91.0% 89.0% 85.0% 85.0%   Maximum SpO2 98.0% 98.0% 96.0% 98.0%  98.0% 98.0% 98.0% 98.0%    DIAGNOSTIC Oxygen Saturation Distribution  Range (%) Time in range (min) Time in range (%)   90.0 - 100.0 204.8 85.1%  80.0 - 90.0 35.6 14.8%  70.0 - 80.0 0.2 0.1%  60.0 - 70.0 - -  50.0 - 60.0 - -  0.0 - 50.0 - -  Time Spent <=88% SpO2  Range (%) Time in range (min) Time in range (%)  0.0  - 88.0 19.4 8.1%      Count Index  Desaturations: 129 64.0   TREATMENT Oxygen Saturation Distribution  Range (%) Time in range (min) Time in range (%)   90.0 - 100.0 168.6 97.2%  80.0 - 90.0 4.8 2.8%  70.0 - 80.0 - -  60.0 - 70.0 - -  50.0 - 60.0 - -  0.0 - 50.0 - -  Time Spent <=88% SpO2  Range (%) Time in range (min) Time in range (%)  0.0 - 88.0 1.1 0.7%      Count Index  Desaturations: 43 17.0     Cardiac Summary   DIAGNOSTIC TREATMENT   Wake NREM REM Total Wake NREM REM Total  Average Pulse Rate (BPM) 76.8 71.4 64.6 74.0 70.0 67.5 71.1 70.3  Minimum Pulse Rate (BPM) 59.0 58.0 58.0 58.0 61.0 59.0 57.0 57.0  Maximum Pulse Rate (BPM) 196.0 93.0 75.0 196.0 91.0 86.0 93.0 93.0   Pulse Rate Distribution   DIAGNOSTIC  Range (bpm) Time in range (min) Time in range (%)  0.0 - 40.0 - -  40.0 - 60.0 1.3 0.5%  60.0 - 80.0 208.6 86.2%  80.0 - 100.0 31.8 13.2%  100.0 - 120.0 - -  120.0 - 140.0 - -  140.0 - 200.0 0.1 0.1%   TREATMENT  Range (bpm) Time in range (min) Time in range (%)  0.0 - 40.0 - -  40.0 - 60.0 1.2 0.7%  60.0 - 80.0 166.8 96.2%  80.0 - 100.0 5.4 3.1%  100.0 - 120.0 - -  120.0 - 140.0 - -  140.0 - 200.0 - -   EtCO2 Summary - Diagnostic  Stage Min (mmHg) Average (mmHg) Max (mmHg)  Wake - - -  NREM (1+2+3) - - -  REM - - -   EtCO2 Distribution:  Range (mmHg) Time in range (min) Time in range (%)  20.0 - 40.0 - -  40.0 - 50.0 - -  50.0 - 100.0 - -  55.0 - 100.0 - -  Excluded data <20.0 & >65.0 246.0 100.0%   Titration Summary  PAP Device PAP Level O2 Level Time (min) Wake (min) NREM (min) REM (min) Sleep Eff% OA# CA# MA# Hyp# (>=3%) AHI (>=3%) Hyp# (>=4%) AHI (>=%4) RERA RDI OSat <=88% (min) Min [employer] Ar. Index  - Off - 246.0 125.0 117.0 4.0 49.2% 73 1 - 53 63.0 33  53.1 -  63.0  17.1 80.0 91.0 51.6  CPAP 9 - 6.5 0.0 6.5 0.0 100.0% - - - 8 73.8 4  36.9 -  73.8  0.0 90.0 93.4 18.5  CPAP 11 - 13.0 1.5 11.5 0.0 88.5% - - - 11  57.4 5  26.1 -  57.4  0.0 89.0 93.3 36.5  CPAP 13 - 8.5 0.0 3.0 5.5 100.0% - - - 4 28.2 3  21.2 -  28.2  0.9 85.0 91.6 7.1  CPAP 15 - 14.0 0.0 0.0 14.0 100.0% 1 - - 3 17.1 -  4.3 -  17.1  0.2 87.0 92.2 -  CPAP 17 - 11.5 0.0 0.0 11.5 100.0% - - - 2 10.4 1  5.2 -  10.4  0.0 90.0 92.9 -  CPAP 18 - 61.5 19.0 7.5 35.0 69.1% - - - 2 2.8 2  2.8 -  2.8  0.0 90.0 94.0 8.5  CPAP 19 - 26.5 4.5 3.0 19.0 83.0% - - - 3 8.2 -  - -  8.2  0.0 92.0 94.3 10.9  CPAP 20 - 10.5 0.0 0.0 10.5 100.0% - - - 1 5.7 1  5.7 -  5.7  0.0 93.0 94.3 11.4  BiLevel 22/18 - 16.5 0.0 0.0 16.5 100.0% - - - 2 7.3 -  - -  7.3  0.0 92.0 94.2 7.3  BiLevel 23/19 - 9.5 1.5 0.0 8.0 84.2% - - - - - -  - -  -  0.0 93.0 94.2 7.5    Hypnograms                           Technologist Comments  The patient was here at sleep lab for snoring.  A split night study was ordered. The patient was fitted with a small airfit F10 for her ResMed FFM.  Pt met split night protocol. The CPAP pressure was started at 6cm H20 and increased to CPAP pressure of 20cm H20.The patient was switch over to bipap pressure of 22/18 due to high cpap pressure, snoring and hypop's.  Pt was titrated to BILEVEL pressure 23/19cm H20 with heated humidity and heated tubbing. The patient tolerated CPAP pressure well.  Respiratory events were eliminated. Snoring was eliminated. PLMs were rare. EKG showed NSR. The patient took her medications at 9 pm. No oxygen was applied. The had two restroom visits.  -Tech                          Jolee Naval, Biomedical engineer of Sleep Medicine  ELECTRONICALLY SIGNED ON:  05/08/2023, 12:11 PM [facility] PH: [phone]   FX: [phone] ACCREDITED BY THE [employer]ICAN ACADEMY OF SLEEP MEDICINE

## 2023-05-10 ENCOUNTER — Telehealth: Payer: Self-pay | Admitting: Family Medicine

## 2023-05-10 ENCOUNTER — Other Ambulatory Visit: Payer: Self-pay | Admitting: Family Medicine

## 2023-05-10 DIAGNOSIS — F32A Depression, unspecified: Secondary | ICD-10-CM

## 2023-05-10 NOTE — Telephone Encounter (Signed)
 Please let Jessica Cunningham know I got her sleep study back and I am happy to schedule a virtual appointment to go over the results together.

## 2023-05-10 NOTE — Telephone Encounter (Signed)
 Called patient she is scheduled via mychart on May 1st at 11:30am

## 2023-05-11 ENCOUNTER — Other Ambulatory Visit: Payer: Self-pay | Admitting: *Deleted

## 2023-05-12 ENCOUNTER — Telehealth (INDEPENDENT_AMBULATORY_CARE_PROVIDER_SITE_OTHER): Admitting: Family Medicine

## 2023-05-12 ENCOUNTER — Encounter: Payer: Self-pay | Admitting: Family Medicine

## 2023-05-12 DIAGNOSIS — G4733 Obstructive sleep apnea (adult) (pediatric): Secondary | ICD-10-CM

## 2023-05-12 MED ORDER — AMBULATORY NON FORMULARY MEDICATION: 0 refills | Status: DC

## 2023-05-12 NOTE — Progress Notes (Signed)
    Virtual Visit via Video Note  I connected with Jessica Cunningham on 05/12/23 at 11:30 AM EDT by a video enabled telemedicine application and verified that I am speaking with the correct person using two identifiers.   I discussed the limitations of evaluation and management by telemedicine and the availability of in person appointments. The patient expressed understanding and agreed to proceed.  Patient location: at home Provider location: in office  Subjective:    CC:   Chief Complaint  Patient presents with   Sleep Apnea    HPI: Here for virtual visit to discuss recent sleep study results.  Back in January when I saw her she had mentioned that she falls asleep easily during the day and snores and family actually had reported loud snoring and occasional gasping for air at night.  We got her evaluated with a split night sleep study.   Sleep study was performed April 16, split sleep study at [facility] sleep center.  Is consistent with severe sleep apnea with an AHI of 53.1/hr, , A nadir of 80%, mean and 92.2%.  Recommendation is for BiPAP set to 28/19 PS 0.  She was fitted with a small ResMed AirFit F10 with heated humidification.  She is very positive about moving forward with treatment.  She feels this could really positively impact her health.  She has some days where she feels so tired she actually does not drive.  She has frequent headaches even though they have been better controlled with the propranolol .  Past medical history, Surgical history, Family history not pertinant except as noted below, Social history, Allergies, and medications have been entered into the medical record, reviewed, and corrections made.    Objective:    General: Speaking clearly in complete sentences without any shortness of breath.  Alert and oriented x3.  Normal judgment. No apparent acute distress.    Impression and Recommendations:    Problem List Items Addressed This Visit        Respiratory   Sleep apnea - Primary   Discussed results today.   Sleep study was performed April 16, split sleep study at [facility] sleep center.  Is consistent with severe sleep apnea with an AHI of 53.1/hr, , A nadir of 80%, mean and 92.2%.  Recommendation is for BiPAP set to 28/19 PS 0.  She was fitted with a small ResMed AirFit F10 with heated humidification.  Will send orders via [employer] today.  Call if not heard from them by end of next week.        Relevant Medications   AMBULATORY NON FORMULARY MEDICATION    No orders of the defined types were placed in this encounter.   Meds ordered this encounter  Medications   AMBULATORY NON FORMULARY MEDICATION    Sig: Medication Name: BiPAP 23/19, PS 0 with ResMed AirFit F 10 with heated humidifer. Dx OSA w/ AHI 53.1.    Dispense:  1 Units    Refill:  0    I discussed the assessment and treatment plan with the patient. The patient was provided an opportunity to ask questions and all were answered. The patient agreed with the plan and demonstrated an understanding of the instructions.   The patient was advised to call back or seek an in-person evaluation if the symptoms worsen or if the condition fails to improve as anticipated.   Duaine German, MD

## 2023-05-12 NOTE — Assessment & Plan Note (Addendum)
 Discussed results today.   Sleep study was performed April 16, split sleep study at [facility] sleep center.  Is consistent with severe sleep apnea with an AHI of 53.1/hr, , A nadir of 80%, mean and 92.2%.  Recommendation is for BiPAP set to 28/19 PS 0.  She was fitted with a small ResMed AirFit F10 with heated humidification.  Will send orders via [employer] today.  Call if not heard from them by end of next week.

## 2023-05-19 DIAGNOSIS — L918 Other hypertrophic disorders of the skin: Secondary | ICD-10-CM | POA: Diagnosis not present

## 2023-05-19 DIAGNOSIS — L821 Other seborrheic keratosis: Secondary | ICD-10-CM | POA: Diagnosis not present

## 2023-05-19 DIAGNOSIS — D2272 Melanocytic nevi of left lower limb, including hip: Secondary | ICD-10-CM | POA: Diagnosis not present

## 2023-05-19 DIAGNOSIS — L300 Nummular dermatitis: Secondary | ICD-10-CM | POA: Diagnosis not present

## 2023-05-20 ENCOUNTER — Other Ambulatory Visit: Payer: Self-pay | Admitting: Family Medicine

## 2023-05-26 ENCOUNTER — Encounter: Payer: Self-pay | Admitting: Family Medicine

## 2023-06-01 ENCOUNTER — Other Ambulatory Visit: Payer: Self-pay | Admitting: Family Medicine

## 2023-06-01 DIAGNOSIS — G43009 Migraine without aura, not intractable, without status migrainosus: Secondary | ICD-10-CM

## 2023-06-20 DIAGNOSIS — G4733 Obstructive sleep apnea (adult) (pediatric): Secondary | ICD-10-CM | POA: Diagnosis not present

## 2023-07-20 ENCOUNTER — Encounter: Payer: Self-pay | Admitting: Family Medicine

## 2023-07-20 DIAGNOSIS — G4733 Obstructive sleep apnea (adult) (pediatric): Secondary | ICD-10-CM | POA: Diagnosis not present

## 2023-07-20 MED ORDER — AMBULATORY NON FORMULARY MEDICATION: 0 refills | Status: DC

## 2023-07-20 NOTE — Telephone Encounter (Signed)
  Changed BiPAP setting to 20/17

## 2023-07-20 NOTE — Telephone Encounter (Signed)
 Will send to [employer] to get this updated for her.

## 2023-08-30 ENCOUNTER — Encounter: Payer: Self-pay | Admitting: Family Medicine

## 2023-08-31 DIAGNOSIS — M79605 Pain in left leg: Secondary | ICD-10-CM | POA: Diagnosis not present

## 2023-08-31 DIAGNOSIS — M94262 Chondromalacia, left knee: Secondary | ICD-10-CM | POA: Diagnosis not present

## 2023-09-15 ENCOUNTER — Ambulatory Visit: Admitting: Family Medicine

## 2023-09-15 ENCOUNTER — Encounter: Payer: Self-pay | Admitting: Family Medicine

## 2023-09-15 ENCOUNTER — Ambulatory Visit (INDEPENDENT_AMBULATORY_CARE_PROVIDER_SITE_OTHER)

## 2023-09-15 VITALS — BP 134/56 | HR 75 | Ht 62.0 in | Wt 241.0 lb

## 2023-09-15 DIAGNOSIS — M65341 Trigger finger, right ring finger: Secondary | ICD-10-CM | POA: Diagnosis not present

## 2023-09-15 DIAGNOSIS — M79641 Pain in right hand: Secondary | ICD-10-CM | POA: Diagnosis not present

## 2023-09-15 DIAGNOSIS — M1812 Unilateral primary osteoarthritis of first carpometacarpal joint, left hand: Secondary | ICD-10-CM | POA: Diagnosis not present

## 2023-09-15 DIAGNOSIS — G5603 Carpal tunnel syndrome, bilateral upper limbs: Secondary | ICD-10-CM | POA: Diagnosis not present

## 2023-09-15 DIAGNOSIS — R6889 Other general symptoms and signs: Secondary | ICD-10-CM | POA: Diagnosis not present

## 2023-09-15 DIAGNOSIS — M19032 Primary osteoarthritis, left wrist: Secondary | ICD-10-CM | POA: Diagnosis not present

## 2023-09-15 DIAGNOSIS — M19042 Primary osteoarthritis, left hand: Secondary | ICD-10-CM

## 2023-09-15 DIAGNOSIS — M79605 Pain in left leg: Secondary | ICD-10-CM | POA: Diagnosis not present

## 2023-09-15 DIAGNOSIS — M79642 Pain in left hand: Secondary | ICD-10-CM | POA: Diagnosis not present

## 2023-09-15 DIAGNOSIS — M19041 Primary osteoarthritis, right hand: Secondary | ICD-10-CM

## 2023-09-15 NOTE — Progress Notes (Signed)
 Acute Office Visit  Subjective:     Patient ID: Jessica Cunningham, female    DOB: July 06, 1975, 48 y.o.   MRN: [id number]  Chief Complaint  Patient presents with   Numbness    Bilateral hand numbness this has gotten worse past 2-3 months    HPI Patient is in today for hand pain and carpal tunnel nad trigger finger.   Discussed the use of AI scribe software for clinical note transcription with the patient, who gave verbal consent to proceed.  History of Present Illness  She is here today for couple of concerns she has PAD carpal tunnel in both wrists and hands for quite some time.  She gets bilateral hand numbness.  It often wakes her up at night she feels like it has been going on for years but it is gradually becoming more frequent and more often sometimes she will feel like maybe she slept on her shoulder wrong.  At other times just seems to happen.  She will put ice in the bed and lay her arms and wrists on them.  She gets a lot of pain and stiffness in her hands especially in the mornings.  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  She has also been getting some triggering on her fourth finger on her right hand she says about a week or so ago it actually locked for most 2 hours before she can get it unlocked.   ROS      Objective:    BP (!) 134/56   Pulse 75   Ht 5' 2 (1.575 m)   Wt 241 lb (109.3 kg)   SpO2 94%   BMI 44.08 kg/m    Physical Exam  No results found for any visits on 09/15/23.      Assessment & Plan:   Problem List Items Addressed This Visit   None Visit Diagnoses       Trigger ring finger of right hand    -  Primary   Relevant Orders   Ambulatory referral to Sports Medicine     Carpal tunnel syndrome, bilateral       Relevant Orders   Nerve conduction test      Arthritis of both hands       Relevant Orders   DG Hand 2 View Right   DG Hand 2 View Left   ANA   Sedimentation rate   C-reactive protein   Cyclic citrul peptide antibody, IgG   Uric acid   Anti-CCP Ab, IgG + IgA (RDL)      Assessment and Plan Assessment & Plan  Arthritis in both hands-we did discuss further workup she did have a grandmother with rheumatoid so we will do some labs today and get plain films it does sound more consistent with osteoarthritis she is in her 41s but we did discuss that sometimes genetics can play a role in advancing arthritis.  She is really doing a lot of the normal treatments such as topical anti-inflammatory compression and elevation she does tend to get a lot of swelling in her hands.  Trigger finger-will refer to our sports med doc for definitive treatment.  Bilateral carpal tunnel we discussed possible surgical intervention at this point and getting further evaluation with nerve conduction studies first to determine  the severity of the carpal tunnel in the meantime continue with icing and splinting.    No orders of the defined types were placed in this encounter.   Return if symptoms worsen or fail to improve.  Dorothyann Byars, MD

## 2023-09-16 LAB — ANA: Anti Nuclear Antibody (ANA): NEGATIVE

## 2023-09-16 LAB — SEDIMENTATION RATE: Sed Rate: 33 mm/h — ABNORMAL HIGH (ref 0–32)

## 2023-09-16 LAB — URIC ACID: Uric Acid: 5.1 mg/dL (ref 2.6–6.2)

## 2023-09-16 LAB — C-REACTIVE PROTEIN: CRP: 33 mg/L — ABNORMAL HIGH (ref 0–10)

## 2023-09-19 ENCOUNTER — Ambulatory Visit: Payer: Self-pay | Admitting: Family Medicine

## 2023-09-19 NOTE — Progress Notes (Signed)
 Hi Mandi, inflammatory markers just mildly elevated not in the range we would typically see with autoimmune diseases.  Negative for lupus so far and the gout uric acid level actually looks really good.  Still awaiting the anti-CCP.

## 2023-09-20 DIAGNOSIS — G4733 Obstructive sleep apnea (adult) (pediatric): Secondary | ICD-10-CM | POA: Diagnosis not present

## 2023-09-20 LAB — ANTI-CCP AB, IGG + IGA (RDL): Anti-CCP Ab, IgG + IgA (RDL): <20 U (ref <20)

## 2023-09-20 NOTE — Progress Notes (Signed)
 HI Mandi. Test for rheumatoid arthritis came back negative.

## 2023-09-27 NOTE — Progress Notes (Signed)
 Hi Mandi, x-ray of the left hand does show some degenerative cysts in the scaphoid which is one of the wrist bones and some mild arthritis at the base of the thumb.  The right hand actually looks pretty good.  They did not see any significant abnormalities.  topical anti-inflammatory gels like Voltaren  can be helpful for pain relief.  You can also discuss further with the sports med doc I know we had placed a referral for the trigger finger.

## 2023-09-27 NOTE — Progress Notes (Signed)
 See notation on x-ray of the left hand.

## 2023-09-29 DIAGNOSIS — M79605 Pain in left leg: Secondary | ICD-10-CM | POA: Diagnosis not present

## 2023-09-29 DIAGNOSIS — R6889 Other general symptoms and signs: Secondary | ICD-10-CM | POA: Diagnosis not present

## 2023-10-04 DIAGNOSIS — M79605 Pain in left leg: Secondary | ICD-10-CM | POA: Diagnosis not present

## 2023-10-04 DIAGNOSIS — R6889 Other general symptoms and signs: Secondary | ICD-10-CM | POA: Diagnosis not present

## 2023-10-06 ENCOUNTER — Ambulatory Visit: Admitting: Sports Medicine

## 2023-10-06 ENCOUNTER — Ambulatory Visit

## 2023-10-06 ENCOUNTER — Other Ambulatory Visit: Payer: Self-pay

## 2023-10-06 VITALS — BP 134/88 | Ht 62.25 in | Wt 230.0 lb

## 2023-10-06 DIAGNOSIS — M79644 Pain in right finger(s): Secondary | ICD-10-CM

## 2023-10-06 DIAGNOSIS — M6530 Trigger finger, unspecified finger: Secondary | ICD-10-CM

## 2023-10-06 MED ORDER — TRIAMCINOLONE ACETONIDE 40 MG/ML IJ SUSP
40.0000 mg | Freq: Once | INTRAMUSCULAR | Status: AC
  Administered 2023-10-06: 40 mg via INTRA_ARTICULAR

## 2023-10-06 NOTE — Progress Notes (Cosign Needed)
 PCP: Alvan Dorothyann BIRCH, MD  Subjective:   HPI: Patient is a 48 y.o. female here complaining of right fourth digit trigger finger.  Says symptoms first started in April of this year.  No known inciting event.  Has meeting slowly worse over the last couple months.  Usually worse in the mornings.  Triggers include writing or anything that requires firm grip.  Has tried some ice which has helped mildly.  Interested in injection today for more definitive treatment.  Past Medical History:  Diagnosis Date   Allergy    Depression    Hyperlipidemia     Current Outpatient Medications on File Prior to Visit  Medication Sig Dispense Refill   albuterol  (VENTOLIN  HFA) 108 (90 Base) MCG/ACT inhaler Inhale 2 puffs into the lungs every 6 (six) hours as needed for wheezing or shortness of breath. 8 g 0   AMBULATORY NON FORMULARY MEDICATION Medication Name: BiPAP 20(IPAP)/17 (EPAP), PS 0 with ResMed AirFit F 10 with heated humidifer. Dx OSA w/ AHI 53.1. 1 Units 0   diclofenac  sodium (VOLTAREN ) 1 % GEL Apply 2 g topically 4 (four) times daily. To affected joint. 100 g 11   DULoxetine  (CYMBALTA ) 30 MG capsule TAKE 1 CAPSULE BY MOUTH EVERY DAY 90 capsule 1   eletriptan  (RELPAX ) 20 MG tablet TAKE 1 TABLET (20 MG TOTAL) BY MOUTH AS NEEDED FOR MIGRAINE OR HEADACHE. MAY REPEAT IN 2 HOURS IF HEADACHE PERSISTS OR RECURS. 9 tablet 12   esomeprazole  (NEXIUM ) 20 MG capsule Take 1 capsule (20 mg total) by mouth daily as needed. 90 capsule 3   fexofenadine (ALLEGRA) 180 MG tablet Take 180 mg by mouth daily.     ipratropium (ATROVENT ) 0.06 % nasal spray Place 1 spray into both nostrils 4 (four) times daily as needed. 15 mL PRN   Krill Oil 500 MG CAPS Take 500 mg by mouth daily.     Levonorgestrel-Ethinyl Estradiol (AMETHIA) 0.15-0.03 &0.01 MG tablet Take 1 tablet by mouth daily.     propranolol  ER (INDERAL  LA) 60 MG 24 hr capsule TAKE 1 CAPSULE (60 MG TOTAL) BY MOUTH AT BEDTIME. FOR MIGRAINES 90 capsule 1   rOPINIRole   (REQUIP ) 0.25 MG tablet Take 1-2 tablets (0.25-0.5 mg total) by mouth at bedtime. 180 tablet 1   No current facility-administered medications on file prior to visit.    Past Surgical History:  Procedure Laterality Date   CESAREAN SECTION     x 2    TONSILLECTOMY     WISDOM TOOTH EXTRACTION      Allergies  Allergen Reactions   Paxil [Paroxetine Hcl] Other (See Comments)    Made her crazy, hallucination   Topamax  [Topiramate ]     Forgetful/not been able to complete sentences.    Wellbutrin  [Bupropion ] Other (See Comments)    Hand shakes    BP 134/88   Ht 5' 2.25 (1.581 m)   Wt 230 lb (104.3 kg)   BMI 41.73 kg/m       No data to display              No data to display              Objective:  Physical Exam:  Gen: NAD, comfortable in exam room  On inspection no evidence of erythema, ecchymosis, or effusion.  Patient has full active and passive range of motion with occasional triggering with full fourth digit flexion.  She is palpable nodule just proximal to fourth MCP joint.  No other tenderness  to palpation over bony landmarks of hand and fourth digit.  Varus and valgus stress testing negative.   Assessment & Plan:   #Trigger finger fourth digit on right hand -Discussed conservative treatment including activity modification (avoiding repetitive gripping or pinching), splinting (preferably with the metacarpophalangeal joint in extension), and physical therapy. - Also discussed corticosteroid injection into A1 pulley.  Ultimately patient elected to proceed with this as treatment plan.  Patient tolerated trigger finger injection well with no side effects postprocedure.  See procedure note below. -Can continue with conservative treatment plan described above as well as massaging Voltaren  gel and to spot over A1 pulley 3 times a day for 2 weeks starting on Monday of next week. -If still having symptoms of triggering in 4 weeks she is to return to clinic for  repeat injection. -Patient understands and agrees to treatment plan.  No other questions at this time.  Trigger finger injection, Right 4th finger After discussion on risks/benefits/indications and informed written consent was obtained, a timeout was performed. The area of tenderness at the A1 pulley was identified with ultrasound guidance with palpable nodule located and marked. This area was cleaned with chlorhexidine swab and alcohol swabs. After sterile precautions were taken, a 25-gauge, 5/8 needle was inserted into the A1 pulley with 1 cc Kenalog  1 cc lidocaine 1% and spread of hypoechoic fluid is visualized under ultrasound guidance and between flexor tendon and A1 pulley.  Patient tolerated procedure well and was observed for at least 5 minutes after injection with resolution of pain and improved ROM.

## 2023-10-09 ENCOUNTER — Encounter: Payer: Self-pay | Admitting: Family Medicine

## 2023-10-10 DIAGNOSIS — R6889 Other general symptoms and signs: Secondary | ICD-10-CM | POA: Diagnosis not present

## 2023-10-10 DIAGNOSIS — M79605 Pain in left leg: Secondary | ICD-10-CM | POA: Diagnosis not present

## 2023-10-18 NOTE — Telephone Encounter (Signed)
 Spoke with [employer]-   her computer system is down . Once this is back up she is going to check  and see if anything more needed from our office for coverage of the BI-Pap and get back to us . Gave both direct line and regular office number for a return call.

## 2023-10-24 NOTE — Telephone Encounter (Signed)
 Attempted call to patient. Left a voice mail message requesting a return call.

## 2023-10-24 NOTE — Telephone Encounter (Signed)
 Spoke with Pam at [facility] She will have CMN form faxed over asap for completion today  Gave both fax # [phone] and [phone]

## 2023-11-01 NOTE — Telephone Encounter (Signed)
 Company has been changed to [employer] previously- attempted to reach out to patient to see if she had been having success with contact and getting bi-pap from the new company.

## 2023-11-01 NOTE — Telephone Encounter (Signed)
 Patient states that she has spoken with [employer] and they are in process of working on this for her.

## 2023-11-02 ENCOUNTER — Other Ambulatory Visit: Payer: Self-pay | Admitting: Family Medicine

## 2023-11-02 DIAGNOSIS — F32A Depression, unspecified: Secondary | ICD-10-CM

## 2023-11-14 ENCOUNTER — Encounter: Payer: Self-pay | Admitting: Family Medicine

## 2023-11-14 DIAGNOSIS — G4733 Obstructive sleep apnea (adult) (pediatric): Secondary | ICD-10-CM

## 2023-11-14 MED ORDER — AMBULATORY NON FORMULARY MEDICATION: 0 refills | Status: AC

## 2023-11-14 NOTE — Telephone Encounter (Signed)
 Ok printed prescription with pressure changes.  Please get that over to the new DME supplier and let patient know once we have faxed everything over to them.

## 2023-11-14 NOTE — Telephone Encounter (Signed)
 Spoke with [facility] and gave verbal order to adjust patient Bipap settings as written by provider.  Representative states she will go into [employer] and adjust the settings.

## 2023-11-21 DIAGNOSIS — Z01419 Encounter for gynecological examination (general) (routine) without abnormal findings: Secondary | ICD-10-CM | POA: Diagnosis not present

## 2023-11-21 DIAGNOSIS — Z6841 Body Mass Index (BMI) 40.0 and over, adult: Secondary | ICD-10-CM | POA: Diagnosis not present

## 2023-11-21 DIAGNOSIS — Z1231 Encounter for screening mammogram for malignant neoplasm of breast: Secondary | ICD-10-CM | POA: Diagnosis not present

## 2023-11-22 ENCOUNTER — Other Ambulatory Visit: Payer: Self-pay | Admitting: Family Medicine

## 2023-11-22 DIAGNOSIS — G43009 Migraine without aura, not intractable, without status migrainosus: Secondary | ICD-10-CM

## 2023-12-02 ENCOUNTER — Ambulatory Visit: Payer: Self-pay | Admitting: Family Medicine

## 2023-12-02 NOTE — Progress Notes (Signed)
 Hi Mandi, compliance report looks great.  AHI level is therapeutic and it looks like you are using it pretty consistently.  I would encourage you to schedule your mammogram if you have not done so this year.  We are also happy to help schedule you downstairs if you would like to just let us  know.

## 2023-12-08 DIAGNOSIS — G4733 Obstructive sleep apnea (adult) (pediatric): Secondary | ICD-10-CM | POA: Diagnosis not present

## 2023-12-15 ENCOUNTER — Encounter: Payer: Self-pay | Admitting: Family Medicine

## 2023-12-16 NOTE — Telephone Encounter (Signed)
 Called her back about Joni
